# Patient Record
Sex: Male | Born: 1985 | Race: White | Hispanic: No | Marital: Married | Smoking: Never smoker
Health system: Southern US, Community
[De-identification: ages and names within clinical notes are randomized; demographics above are authoritative.]

## PROBLEM LIST (undated history)

## (undated) DIAGNOSIS — Z91018 Allergy to other foods: Secondary | ICD-10-CM

## (undated) HISTORY — DX: Allergy to other foods: Z91.018

## (undated) HISTORY — PX: WISDOM TOOTH EXTRACTION: SHX21

---

## 2015-01-29 ENCOUNTER — Emergency Department (HOSPITAL_COMMUNITY): Admission: EM | Admit: 2015-01-29 | Discharge: 2015-01-29 | Discharge status: Absent without leave | Payer: Self-pay

## 2015-01-29 ENCOUNTER — Emergency Department (HOSPITAL_COMMUNITY)
Admission: EM | Admit: 2015-01-29 | Discharge: 2015-01-29 | Discharge status: Absent without leave | Payer: Self-pay | Source: Home / Self Care

## 2015-01-29 ENCOUNTER — Encounter (HOSPITAL_COMMUNITY): Payer: Self-pay | Admitting: Emergency Medicine

## 2015-01-29 ENCOUNTER — Emergency Department (HOSPITAL_COMMUNITY)
Admission: EM | Admit: 2015-01-29 | Discharge: 2015-01-29 | Disposition: A | Discharge status: Home / Self Care | Payer: 59 | Source: Home / Self Care | Attending: Family Medicine | Admitting: Family Medicine

## 2015-01-29 DIAGNOSIS — A084 Viral intestinal infection, unspecified: Secondary | ICD-10-CM

## 2015-01-29 MED ORDER — ONDANSETRON 8 MG PO TBDP: 8.0000 mg | ORAL_TABLET | Freq: Three times a day (TID) | ORAL | Status: DC | PRN

## 2015-01-29 MED ORDER — SODIUM CHLORIDE 0.9 % IV BOLUS (SEPSIS)
1000.0000 mL | Freq: Once | INTRAVENOUS | Status: AC
  Administered 2015-01-29: 1000 mL via INTRAVENOUS

## 2015-01-29 MED ORDER — ONDANSETRON HCL 4 MG/2ML IJ SOLN
4.0000 mg | Freq: Once | INTRAMUSCULAR | Status: AC
  Administered 2015-01-29: 4 mg via INTRAVENOUS

## 2015-01-29 MED ORDER — ONDANSETRON HCL 4 MG/2ML IJ SOLN
INTRAMUSCULAR | Status: AC
Start: 2015-01-29 — End: 2015-01-29
  Filled 2015-01-29: qty 2

## 2015-01-29 MED ORDER — ONDANSETRON HCL 4 MG/2ML IJ SOLN: 4.0000 mg | Freq: Once | INTRAMUSCULAR | Status: DC

## 2015-01-29 NOTE — ED Provider Notes (Signed)
James Williams is a 29 y.o. male who presents to Urgent Care today for nausea vomiting diarrhea chills and lightheadedness. Symptoms present for 2 days. Patient has not tried any medications. His wife has similar symptoms. No chest pains palpitations or abdominal pain. No blood in the stool or vomit.   History reviewed. No pertinent past medical history. History reviewed. No pertinent past surgical history. History  Substance Use Topics  . Smoking status: Never Smoker   . Smokeless tobacco: Not on file  . Alcohol Use: No   ROS as above Medications: No current facility-administered medications for this encounter.   Current Outpatient Prescriptions  Medication Sig Dispense Refill  . ondansetron (ZOFRAN ODT) 8 MG disintegrating tablet Take 1 tablet (8 mg total) by mouth every 8 (eight) hours as needed for nausea or vomiting. 20 tablet 0   No Known Allergies   Exam:  BP 134/90 mmHg  Pulse 107  Temp(Src) 98.8 F (37.1 C) (Oral)  Resp 18  SpO2 100% Gen: Well NAD HEENT: EOMI,  MMM Lungs: Normal work of breathing. CTABL Heart: Tachycardia but regular rhythm no MRG Abd: NABS, Soft. Nondistended, Nontender Exts: Brisk capillary refill, warm and well perfused.   Patient was given a 1 L normal saline bolus, and 4 mg IV Zofran, and felt better  No results found for this or any previous visit (from the past 24 hour(s)). No results found.  Assessment and Plan: 29 y.o. male with viral gastroenteritis. Treat with oral Zofran. Return as needed.  Discussed warning signs or symptoms. Please see discharge instructions. Patient expresses understanding.     Rodolph BongEvan S Addison Freimuth, MD 01/29/15 [phone]

## 2015-01-29 NOTE — ED Notes (Signed)
Provided pillows/blankets

## 2015-01-29 NOTE — ED Notes (Signed)
C/o  Nausea, vomiting, and diarrhea.  Chills.   No fever.  Symptoms present since Tuesday.  No otc meds tried.

## 2015-01-29 NOTE — Discharge Instructions (Signed)
Thank you for coming in today. Call or go to the emergency room if you get worse, have trouble breathing, have chest pains, or palpitations.  If your belly pain worsens, or you have high fever, bad vomiting, blood in your stool or black tarry stool go to the Emergency Room.   Viral Gastroenteritis Viral gastroenteritis is also known as stomach flu. This condition affects the stomach and intestinal tract. It can cause sudden diarrhea and vomiting. The illness typically lasts 3 to 8 days. Most people develop an immune response that eventually gets rid of the virus. While this natural response develops, the virus can make you quite ill. CAUSES  Many different viruses can cause gastroenteritis, such as rotavirus or noroviruses. You can catch one of these viruses by consuming contaminated food or water. You may also catch a virus by sharing utensils or other personal items with an infected person or by touching a contaminated surface. SYMPTOMS  The most common symptoms are diarrhea and vomiting. These problems can cause a severe loss of body fluids (dehydration) and a body salt (electrolyte) imbalance. Other symptoms may include:  Fever.  Headache.  Fatigue.  Abdominal pain. DIAGNOSIS  Your caregiver can usually diagnose viral gastroenteritis based on your symptoms and a physical exam. A stool sample may also be taken to test for the presence of viruses or other infections. TREATMENT  This illness typically goes away on its own. Treatments are aimed at rehydration. The most serious cases of viral gastroenteritis involve vomiting so severely that you are not able to keep fluids down. In these cases, fluids must be given through an intravenous line (IV). HOME CARE INSTRUCTIONS   Drink enough fluids to keep your urine clear or pale yellow. Drink small amounts of fluids frequently and increase the amounts as tolerated.  Ask your caregiver for specific rehydration instructions.  Avoid:  Foods  high in sugar.  Alcohol.  Carbonated drinks.  Tobacco.  Juice.  Caffeine drinks.  Extremely hot or cold fluids.  Fatty, greasy foods.  Too much intake of anything at one time.  Dairy products until 24 to 48 hours after diarrhea stops.  You may consume probiotics. Probiotics are active cultures of beneficial bacteria. They may lessen the amount and number of diarrheal stools in adults. Probiotics can be found in yogurt with active cultures and in supplements.  Wash your hands well to avoid spreading the virus.  Only take over-the-counter or prescription medicines for pain, discomfort, or fever as directed by your caregiver. Do not give aspirin to children. Antidiarrheal medicines are not recommended.  Ask your caregiver if you should continue to take your regular prescribed and over-the-counter medicines.  Keep all follow-up appointments as directed by your caregiver. SEEK IMMEDIATE MEDICAL CARE IF:   You are unable to keep fluids down.  You do not urinate at least once every 6 to 8 hours.  You develop shortness of breath.  You notice blood in your stool or vomit. This may look like coffee grounds.  You have abdominal pain that increases or is concentrated in one small area (localized).  You have persistent vomiting or diarrhea.  You have a fever.  The patient is a child younger than 3 months, and he or she has a fever.  The patient is a child older than 3 months, and he or she has a fever and persistent symptoms.  The patient is a child older than 3 months, and he or she has a fever and symptoms suddenly get  worse.  The patient is a baby, and he or she has no tears when crying. MAKE SURE YOU:   Understand these instructions.  Will watch your condition.  Will get help right away if you are not doing well or get worse. Document Released: 10/10/2005 Document Revised: 01/02/2012 Document Reviewed: 07/27/2011 [facility] Patient Information 2015 [place].  This information is not intended to replace advice given to you by your health care provider. Make sure you discuss any questions you have with your health care provider.

## 2016-05-10 DIAGNOSIS — L562 Photocontact dermatitis [berloque dermatitis]: Secondary | ICD-10-CM | POA: Diagnosis not present

## 2016-05-13 MED FILL — predniSONE 10 MG TABS: 10 | 15 days supply | Qty: 46 | Fill #0

## 2016-06-01 DIAGNOSIS — E0590 Thyrotoxicosis, unspecified without thyrotoxic crisis or storm: Secondary | ICD-10-CM | POA: Diagnosis not present

## 2016-06-01 DIAGNOSIS — M25569 Pain in unspecified knee: Secondary | ICD-10-CM | POA: Diagnosis not present

## 2016-06-06 DIAGNOSIS — S4992XA Unspecified injury of left shoulder and upper arm, initial encounter: Secondary | ICD-10-CM | POA: Diagnosis not present

## 2016-06-06 DIAGNOSIS — M546 Pain in thoracic spine: Secondary | ICD-10-CM | POA: Diagnosis not present

## 2016-06-06 DIAGNOSIS — M25512 Pain in left shoulder: Secondary | ICD-10-CM | POA: Diagnosis not present

## 2016-06-06 DIAGNOSIS — M542 Cervicalgia: Secondary | ICD-10-CM | POA: Diagnosis not present

## 2016-06-06 DIAGNOSIS — S199XXA Unspecified injury of neck, initial encounter: Secondary | ICD-10-CM | POA: Diagnosis not present

## 2016-06-06 DIAGNOSIS — S299XXA Unspecified injury of thorax, initial encounter: Secondary | ICD-10-CM | POA: Diagnosis not present

## 2016-06-20 DIAGNOSIS — H5213 Myopia, bilateral: Secondary | ICD-10-CM | POA: Diagnosis not present

## 2016-08-25 DIAGNOSIS — Z299 Encounter for prophylactic measures, unspecified: Secondary | ICD-10-CM | POA: Diagnosis not present

## 2016-08-25 DIAGNOSIS — Z79899 Other long term (current) drug therapy: Secondary | ICD-10-CM | POA: Diagnosis not present

## 2016-08-25 DIAGNOSIS — R5383 Other fatigue: Secondary | ICD-10-CM | POA: Diagnosis not present

## 2016-08-25 DIAGNOSIS — Z0000 Encounter for general adult medical examination without abnormal findings: Secondary | ICD-10-CM | POA: Diagnosis not present

## 2016-08-25 DIAGNOSIS — Z87891 Personal history of nicotine dependence: Secondary | ICD-10-CM | POA: Diagnosis not present

## 2016-08-25 DIAGNOSIS — Z1389 Encounter for screening for other disorder: Secondary | ICD-10-CM | POA: Diagnosis not present

## 2016-10-29 ENCOUNTER — Encounter (HOSPITAL_COMMUNITY): Payer: Self-pay | Admitting: *Deleted

## 2016-10-29 ENCOUNTER — Ambulatory Visit (HOSPITAL_COMMUNITY)
Admission: EM | Admit: 2016-10-29 | Discharge: 2016-10-29 | Disposition: A | Discharge status: Home / Self Care | Payer: 59 | Attending: Family Medicine | Admitting: Family Medicine

## 2016-10-29 DIAGNOSIS — R05 Cough: Secondary | ICD-10-CM | POA: Diagnosis not present

## 2016-10-29 DIAGNOSIS — M791 Myalgia: Secondary | ICD-10-CM

## 2016-10-29 DIAGNOSIS — R509 Fever, unspecified: Secondary | ICD-10-CM | POA: Diagnosis not present

## 2016-10-29 DIAGNOSIS — J111 Influenza due to unidentified influenza virus with other respiratory manifestations: Secondary | ICD-10-CM

## 2016-10-29 DIAGNOSIS — R69 Illness, unspecified: Secondary | ICD-10-CM | POA: Diagnosis not present

## 2016-10-29 MED ORDER — IPRATROPIUM BROMIDE 0.06 % NA SOLN: 2.0000 | Freq: Four times a day (QID) | NASAL | 1 refills | Status: DC

## 2016-10-29 MED ORDER — GUAIFENESIN-CODEINE 100-10 MG/5ML PO SYRP: 10.0000 mL | ORAL_SOLUTION | Freq: Four times a day (QID) | ORAL | 0 refills | Status: DC | PRN

## 2016-10-29 NOTE — ED Triage Notes (Signed)
Coughing   Yesterday   Body  Aches          And fever     Dry   Cough           Symptoms   Worse  Today

## 2016-10-29 NOTE — ED Provider Notes (Signed)
[facility]    CSN: [id number] Arrival date & time: 10/29/16  1745     History   Chief Complaint Chief Complaint  Patient presents with  . Fever    HPI James Williams is a 31 y.o. male.   The history is provided by the patient and the spouse.  Influenza  Presenting symptoms: cough, fever, myalgias and rhinorrhea   Severity:  Mild Onset quality:  Sudden Duration:  1 day Progression:  Unchanged Chronicity:  New Relieved by:  None tried Ineffective treatments:  None tried Associated symptoms: chills, decreased appetite, decreased physical activity and nasal congestion   Risk factors: sick contacts     History reviewed. No pertinent past medical history.  There are no active problems to display for this patient.   No past surgical history on file.     Home Medications    Prior to Admission medications   Medication Sig Start Date End Date Taking? Authorizing Provider  guaiFENesin-codeine (ROBITUSSIN AC) 100-10 MG/5ML syrup Take 10 mLs by mouth 4 (four) times daily as needed for cough. 10/29/16   Linna HoffJames D Child Campoy, MD  ipratropium (ATROVENT) 0.06 % nasal spray Place 2 sprays into both nostrils 4 (four) times daily. 10/29/16   Linna HoffJames D Willye Javier, MD  ondansetron (ZOFRAN ODT) 8 MG disintegrating tablet Take 1 tablet (8 mg total) by mouth every 8 (eight) hours as needed for nausea or vomiting. 01/29/15   Rodolph BongEvan S Corey, MD    Family History History reviewed. No pertinent family history.  Social History Social History  Substance Use Topics  . Smoking status: Never Smoker  . Smokeless tobacco: Never Used  . Alcohol use No     Allergies   Patient has no known allergies.   Review of Systems Review of Systems  Constitutional: Positive for appetite change, chills, decreased appetite and fever.  HENT: Positive for congestion, postnasal drip and rhinorrhea.   Respiratory: Positive for cough.   Cardiovascular: Negative.   Gastrointestinal: Negative.   Musculoskeletal:  Positive for myalgias.  All other systems reviewed and are negative.    Physical Exam Triage Vital Signs ED Triage Vitals  Enc Vitals Group     BP 10/29/16 1823 116/70     Pulse Rate 10/29/16 1823 101     Resp 10/29/16 1823 20     Temp 10/29/16 1823 102.3 F (39.1 C)     Temp Source 10/29/16 1823 Oral     SpO2 10/29/16 1823 100 %     Weight --      Height --      Head Circumference --      Peak Flow --      Pain Score 10/29/16 1825 7     Pain Loc --      Pain Edu? --      Excl. in GC? --    No data found.   Updated Vital Signs BP 116/70 (BP Location: Left Arm)   Pulse 101   Temp 102.3 F (39.1 C) (Oral)   Resp 20   SpO2 100%   Visual Acuity Right Eye Distance:   Left Eye Distance:   Bilateral Distance:    Right Eye Near:   Left Eye Near:    Bilateral Near:     Physical Exam  Constitutional: He is oriented to person, place, and time. He appears well-developed and well-nourished. He appears distressed.  HENT:  Right Ear: External ear normal.  Left Ear: External ear normal.  Nose: Nose normal.  Mouth/Throat: Oropharynx is clear and moist.  Eyes: Pupils are equal, round, and reactive to light.  Neck: Normal range of motion.  Cardiovascular: Normal rate and regular rhythm.   Pulmonary/Chest: Effort normal and breath sounds normal.  Abdominal: Soft. Bowel sounds are normal.  Lymphadenopathy:    He has no cervical adenopathy.  Neurological: He is alert and oriented to person, place, and time.  Skin: Skin is warm and dry.  Nursing note and vitals reviewed.    UC Treatments / Results  Labs (all labs ordered are listed, but only abnormal results are displayed) Labs Reviewed - No data to display  EKG  EKG Interpretation None       Radiology No results found.  Procedures Procedures (including critical care time)  Medications Ordered in UC Medications - No data to display   Initial Impression / Assessment and Plan / UC Course  I have reviewed  the triage vital signs and the nursing notes.  Pertinent labs & imaging results that were available during my care of the patient were reviewed by me and considered in my medical decision making (see chart for details).       Final Clinical Impressions(s) / UC Diagnoses   Final diagnoses:  Influenza-like illness    New Prescriptions Discharge Medication List as of 10/29/2016  6:50 PM    START taking these medications   Details  guaiFENesin-codeine (ROBITUSSIN AC) 100-10 MG/5ML syrup Take 10 mLs by mouth 4 (four) times daily as needed for cough., Starting Sat 10/29/2016, Print    ipratropium (ATROVENT) 0.06 % nasal spray Place 2 sprays into both nostrils 4 (four) times daily., Starting Sat 10/29/2016, Print         Linna Hoff, MD 11/20/16 [phone]

## 2016-11-30 DIAGNOSIS — Z6834 Body mass index (BMI) 34.0-34.9, adult: Secondary | ICD-10-CM | POA: Diagnosis not present

## 2016-11-30 DIAGNOSIS — M546 Pain in thoracic spine: Secondary | ICD-10-CM | POA: Diagnosis not present

## 2016-11-30 DIAGNOSIS — Z713 Dietary counseling and surveillance: Secondary | ICD-10-CM | POA: Diagnosis not present

## 2016-11-30 DIAGNOSIS — Z299 Encounter for prophylactic measures, unspecified: Secondary | ICD-10-CM | POA: Diagnosis not present

## 2016-12-08 MED FILL — BACLOFEN 10 MG TABLET: 10 | 10 days supply | Qty: 20 | Fill #0

## 2017-01-09 DIAGNOSIS — Z789 Other specified health status: Secondary | ICD-10-CM | POA: Diagnosis not present

## 2017-01-09 DIAGNOSIS — Z299 Encounter for prophylactic measures, unspecified: Secondary | ICD-10-CM | POA: Diagnosis not present

## 2017-01-09 DIAGNOSIS — J069 Acute upper respiratory infection, unspecified: Secondary | ICD-10-CM | POA: Diagnosis not present

## 2017-01-09 DIAGNOSIS — Z713 Dietary counseling and surveillance: Secondary | ICD-10-CM | POA: Diagnosis not present

## 2017-01-09 DIAGNOSIS — Z6832 Body mass index (BMI) 32.0-32.9, adult: Secondary | ICD-10-CM | POA: Diagnosis not present

## 2017-03-03 MED FILL — DICLOFENAC SOD 75 MG TAB EC: 75 | 30 days supply | Qty: 60 | Fill #0

## 2017-04-21 DIAGNOSIS — E0590 Thyrotoxicosis, unspecified without thyrotoxic crisis or storm: Secondary | ICD-10-CM | POA: Diagnosis not present

## 2017-04-21 DIAGNOSIS — W57XXXA Bitten or stung by nonvenomous insect and other nonvenomous arthropods, initial encounter: Secondary | ICD-10-CM | POA: Diagnosis not present

## 2017-04-21 DIAGNOSIS — Z299 Encounter for prophylactic measures, unspecified: Secondary | ICD-10-CM | POA: Diagnosis not present

## 2017-04-21 DIAGNOSIS — Z6834 Body mass index (BMI) 34.0-34.9, adult: Secondary | ICD-10-CM | POA: Diagnosis not present

## 2017-04-21 DIAGNOSIS — Z713 Dietary counseling and surveillance: Secondary | ICD-10-CM | POA: Diagnosis not present

## 2017-04-28 MED FILL — DOXYCYCLINE HYCLATE 100 MG: 100 | 14 days supply | Qty: 28 | Fill #0

## 2017-05-19 ENCOUNTER — Ambulatory Visit: Payer: Self-pay | Admitting: Allergy and Immunology

## 2017-06-06 ENCOUNTER — Encounter: Payer: Self-pay | Admitting: Allergy and Immunology

## 2017-06-06 ENCOUNTER — Ambulatory Visit (INDEPENDENT_AMBULATORY_CARE_PROVIDER_SITE_OTHER): Payer: Self-pay | Admitting: Allergy and Immunology

## 2017-06-06 VITALS — BP 140/80 | HR 84 | Resp 16 | Ht 72.0 in | Wt 254.0 lb

## 2017-06-06 DIAGNOSIS — T7800XA Anaphylactic reaction due to unspecified food, initial encounter: Secondary | ICD-10-CM | POA: Diagnosis not present

## 2017-06-06 MED ORDER — EPINEPHRINE 0.3 MG/0.3ML IJ SOAJ: 0.3000 mg | Freq: Once | INTRAMUSCULAR | 1 refills | Status: AC

## 2017-06-06 NOTE — Progress Notes (Signed)
Dear [facility],  Thank you for referring [facility].   Below is a summation of this patient's evaluation and recommendations.  Thank you for your referral. I will keep you informed about this patient's response to treatment.   If you have any questions please do not hesitate to contact me.   Sincerely,  Jessica PriestEric J. Amelya Mabry, MD Allergy / Immunology [facility] [place]   ______________________________________________________________________    NEW PATIENT NOTE  [facility] Date of office visit: 06/06/2017    Subjective:   Chief Complaint:  James GlassmanJoshua R Williams (DOB: [birth year]) is a 31 y.o. male who presents to the clinic on 06/06/2017 with a chief complaint of New Patient (Initial Visit) .     HPI: [name] presents to this clinic in evaluation of possible alpha gal syndrome. Apparently in June 2018 he started to develop problems with abdominal discomfort and nausea after eating mammal meat. As well, he had these issues of lightheadedness and he had the development of some issues with not being able to focus mentally after eating meats. [place] the [place] episode where he ate a steak and about 2 hours later he developed a sensation of significant throat tightness and once again abdominal discomfort and nausea. His primary care doctor checked an alpha gal titer which was elevated and he has been mammal meat free since the end of June and has resolved all the issues with his abdominal discomfort and lightheadedness and feels great.  History reviewed. No pertinent past medical history.  History reviewed. No pertinent surgical history.  Allergies as of 06/06/2017      Reactions   Beef-derived Products    Alpha Gal   Lambs Quarters    Alpha Gal   Pork-derived Products    Alpha  Gal      Medication List      ipratropium 0.06 % nasal spray Commonly known as:  ATROVENT Place 2 sprays into both nostrils 4 (four) times daily.       Review of systems negative except as noted in HPI / PMHx or noted below:  Review of Systems  Constitutional: Negative.   HENT: Negative.   Eyes: Negative.   Respiratory: Negative.   Cardiovascular: Negative.   Gastrointestinal: Negative.   Genitourinary: Negative.   Musculoskeletal: Negative.   Skin: Negative.   Neurological: Negative.   Endo/Heme/Allergies: Negative.   Psychiatric/Behavioral: Negative.     History reviewed. No pertinent family history.  Social History   Social History  . Marital status: Married    Spouse name: N/A  . Number of children: N/A  . Years of education: N/A   Occupational History  . Not on file.   Social History Main Topics  . Smoking status: Never Smoker  . Smokeless tobacco: Never Used  . Alcohol use No  . Drug use: Unknown  . Sexual activity: Not on file   Other Topics Concern  . Not on file   Social History Narrative  . No narrative on file    Environmental and Social history  Lives in a house with a dry environment, no animals located inside the household, carpeting in the bedroom, plastic on the bed, no plastic on the pillow, no smokers located inside the household. He works in an office setting.  Objective:   Vitals:  06/06/17 1453  BP: 140/80  Pulse: 84  Resp: 16   Height: 6' (182.9 cm) Weight: 254 lb (115.2 kg)  Physical Exam  Constitutional: He is well-developed, well-nourished, and in no distress.  HENT:  Head: Normocephalic. Head is without right periorbital erythema and without left periorbital erythema.  Right Ear: Tympanic membrane, external ear and ear canal normal.  Left Ear: Tympanic membrane, external ear and ear canal normal.  Nose: Nose normal. No mucosal edema or rhinorrhea.  Mouth/Throat: Uvula is midline, oropharynx is clear and moist  and mucous membranes are normal. No oropharyngeal exudate.  Eyes: Pupils are equal, round, and reactive to light. Conjunctivae and lids are normal.  Neck: Trachea normal. No tracheal tenderness present. No tracheal deviation present. No thyromegaly present.  Cardiovascular: Normal rate, regular rhythm, S1 normal, S2 normal and normal heart sounds.   No murmur heard. Pulmonary/Chest: Effort normal and breath sounds normal. No stridor. No tachypnea. No respiratory distress. He has no wheezes. He has no rales. He exhibits no tenderness.  Abdominal: Soft. He exhibits no distension and no mass. There is no hepatosplenomegaly. There is no tenderness. There is no rebound and no guarding.  Musculoskeletal: He exhibits no edema or tenderness.  Lymphadenopathy:       Head (right side): No tonsillar adenopathy present.       Head (left side): No tonsillar adenopathy present.    He has no cervical adenopathy.    He has no axillary adenopathy.  Neurological: He is alert. Gait normal.  Skin: No rash noted. He is not diaphoretic. No erythema. No pallor. Nails show no clubbing.  Psychiatric: Mood and affect normal.    Diagnostics: Review of blood tests forwarded by [facility] medicine identifies a alpha gal IgE titer of 1.72 KU/L with beef at 0.63 KU/L and negative Lamb and pork IgE.  Assessment and Plan:    1. Anaphylactic shock due to food, initial encounter     1. Allergen avoidance measures directed against mammal consumption  2. Auvi-Q 0.3, Benadryl, M.D./ER evaluation for allergic reaction  3. Return to clinic in 1 year or earlier if problem  4. Repeat blood tests in one year  5. 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 He will need to remain away from mammal meat consumption for the next year and he needs to be very careful when he goes out to eat at restaurants and loses control of preparation of his  foods. I will see him back in this clinic in 1 year and we will recheck his titers at that point in time and see if he is a candidate for possible in clinic food challenge.  Jessica Priest, MD Allergy / Immunology  [facility]

## 2017-06-06 NOTE — Patient Instructions (Addendum)
  1. Allergen avoidance measures directed against mammal consumption  2. Auvi-Q 0.3, Benadryl, M.D./ER evaluation for allergic reaction  3. Return to clinic in 1 year or earlier if problem  4. Repeat blood tests in one year  5. Obtain a flu vaccine this fall

## 2017-06-07 ENCOUNTER — Encounter: Payer: Self-pay | Admitting: Allergy and Immunology

## 2017-06-07 ENCOUNTER — Encounter: Payer: Self-pay | Admitting: *Deleted

## 2017-08-30 DIAGNOSIS — Z1331 Encounter for screening for depression: Secondary | ICD-10-CM | POA: Diagnosis not present

## 2017-08-30 DIAGNOSIS — Z0000 Encounter for general adult medical examination without abnormal findings: Secondary | ICD-10-CM | POA: Diagnosis not present

## 2017-08-30 DIAGNOSIS — R5383 Other fatigue: Secondary | ICD-10-CM | POA: Diagnosis not present

## 2017-08-30 DIAGNOSIS — Z299 Encounter for prophylactic measures, unspecified: Secondary | ICD-10-CM | POA: Diagnosis not present

## 2017-08-30 DIAGNOSIS — E0590 Thyrotoxicosis, unspecified without thyrotoxic crisis or storm: Secondary | ICD-10-CM | POA: Diagnosis not present

## 2017-08-30 DIAGNOSIS — Z79899 Other long term (current) drug therapy: Secondary | ICD-10-CM | POA: Diagnosis not present

## 2017-08-30 DIAGNOSIS — Z6834 Body mass index (BMI) 34.0-34.9, adult: Secondary | ICD-10-CM | POA: Diagnosis not present

## 2017-12-05 MED FILL — CHERATUSSIN AC SYRUP: 100-10 | 12 days supply | Qty: 120 | Fill #0

## 2017-12-05 MED FILL — BENZONATATE 100 MG CAPS: 100 | 10 days supply | Qty: 30 | Fill #0

## 2018-03-29 MED FILL — AMOX-CLAV 875-125 MG TABLET: 875-125 | 10 days supply | Qty: 20 | Fill #0

## 2018-04-05 ENCOUNTER — Encounter: Payer: Self-pay | Admitting: Allergy and Immunology

## 2018-04-05 ENCOUNTER — Ambulatory Visit (INDEPENDENT_AMBULATORY_CARE_PROVIDER_SITE_OTHER): Payer: No Typology Code available for payment source | Admitting: Allergy and Immunology

## 2018-04-05 VITALS — BP 122/84 | HR 72 | Resp 20

## 2018-04-05 DIAGNOSIS — T7800XD Anaphylactic reaction due to unspecified food, subsequent encounter: Secondary | ICD-10-CM

## 2018-04-05 MED ORDER — AUVI-Q 0.3 MG/0.3ML IJ SOAJ: INTRAMUSCULAR | 1 refills | Status: DC

## 2018-04-05 NOTE — Patient Instructions (Addendum)
  1. Allergen avoidance measures directed against mammal consumption  2. Auvi-Q 0.3, Benadryl, M.D./ER evaluation for allergic reaction  3. Blood -alpha gal panel  4.  Mammal challenge?

## 2018-04-05 NOTE — Progress Notes (Signed)
     Follow-up Note  [facility] Primary Provider: Selinda FlavinHoward, Kevin, MD Date of Office Visit: 04/05/2018  Subjective:   James GlassmanJoshua R Williams (DOB: [birth year]) is a 32 y.o. male who [facility] on 04/05/2018 in re-evaluation of the following:  HPI: James BootyJoshua presents to this clinic in evaluation of alpha gal syndrome.  I have not seen him in his clinic since 06 June 2017.  He has not been consuming any mammal and he has not been having any abdominal discomfort or nausea or other signs of allergic reaction.  He does have an injectable epinephrine device.  Allergies as of 04/05/2018      Reactions   Beef-derived Products    Alpha Gal   Lambs Quarters    Alpha Gal   Pork-derived Products    Alpha Gal      Medication List      amoxicillin-clavulanate 875-125 MG tablet Commonly known as:  AUGMENTIN TAKE 1 TABLET BY MOUTH TWO TIMES DAILY WITH FOOD       Past Medical History:  Diagnosis Date  . Allergy to alpha-gal    Mammal meat allergy    History reviewed. No pertinent surgical history.  Review of systems negative except as noted in HPI / PMHx or noted below:  Review of Systems  Constitutional: Negative.   HENT: Negative.   Eyes: Negative.   Respiratory: Negative.   Cardiovascular: Negative.   Gastrointestinal: Negative.   Genitourinary: Negative.   Musculoskeletal: Negative.   Skin: Negative.   Neurological: Negative.   Endo/Heme/Allergies: Negative.   Psychiatric/Behavioral: Negative.      Objective:   Vitals:   04/05/18 1604  BP: 122/84  Pulse: 72  Resp: 20          Physical Exam  HENT:  Head: Normocephalic.  Right Ear: Tympanic membrane, external ear and ear canal normal.  Left Ear: Tympanic membrane, external ear and ear canal normal.  Nose: Nose normal. No mucosal edema or rhinorrhea.  Mouth/Throat: Uvula is midline, oropharynx is clear and moist and mucous membranes are normal. No  oropharyngeal exudate.  Eyes: Conjunctivae are normal.  Neck: Trachea normal. No tracheal tenderness present. No tracheal deviation present. No thyromegaly present.  Cardiovascular: Normal rate, regular rhythm, S1 normal, S2 normal and normal heart sounds.  No murmur heard. Pulmonary/Chest: Breath sounds normal. No stridor. No respiratory distress. He has no wheezes. He has no rales.  Musculoskeletal: He exhibits no edema.  Lymphadenopathy:       Head (right side): No tonsillar adenopathy present.       Head (left side): No tonsillar adenopathy present.    He has no cervical adenopathy.  Neurological: He is alert.  Skin: No rash noted. He is not diaphoretic. No erythema. Nails show no clubbing.    Diagnostics: none  Assessment and Plan:   1. Anaphylactic shock due to food, subsequent encounter     1. Allergen avoidance measures directed against mammal consumption  2. Auvi-Q 0.3, Benadryl, M.D./ER evaluation for allergic reaction  3. Blood -alpha gal panel  4.  Mammal challenge?  James BootyJoshua will investigate his alpha gal syndrome with the blood tests noted above and will see if he is a candidate for mammal challenge.  I will contact him with the results of his blood test once it is available for review.  Laurette SchimkeEric Akim Watkinson, MD Allergy / Immunology [facility]

## 2018-04-09 ENCOUNTER — Encounter: Payer: Self-pay | Admitting: Allergy and Immunology

## 2018-04-24 LAB — ALPHA-GAL PANEL
Class Interpretation: 0
Class Interpretation: 0
Class Interpretation: 0
Lamb/Mutton (Ovis spp) IgE: <0.1 kU/L (ref <0.35)
Pork (Sus spp) IgE: <0.1 kU/L (ref <0.35)

## 2018-04-25 ENCOUNTER — Telehealth: Payer: Self-pay | Admitting: Allergy and Immunology

## 2018-04-25 MED FILL — EPINEPHRINE 0.3 MG AUTO-INJ: 0.3 | 30 days supply | Qty: 2 | Fill #0

## 2018-04-25 NOTE — Telephone Encounter (Signed)
James Williams and his wife have called several times.  They would like the results of Kashis's lab results please.

## 2019-07-02 MED FILL — METHYLPREDNISOLONE 4 MG TAB: 4 | 6 days supply | Qty: 21 | Fill #0

## 2019-09-24 MED FILL — predniSONE 10 MG TABS: 10 | 6 days supply | Qty: 21 | Fill #0

## 2019-10-07 MED FILL — CYCLOBENZAPRINE 10 MG TAB: 10 | 10 days supply | Qty: 30 | Fill #0

## 2020-04-13 MED FILL — MECLIZINE 25 MG TABLET: 25 | 14 days supply | Qty: 42 | Fill #0

## 2020-04-13 MED FILL — OMEPRAZOLE 40 MG CPDR: 40 | 30 days supply | Qty: 30 | Fill #0

## 2020-11-05 ENCOUNTER — Other Ambulatory Visit: Payer: Self-pay | Admitting: Surgery

## 2020-11-05 DIAGNOSIS — K8010 Calculus of gallbladder with chronic cholecystitis without obstruction: Secondary | ICD-10-CM

## 2020-11-13 ENCOUNTER — Ambulatory Visit
Admission: RE | Admit: 2020-11-13 | Discharge: 2020-11-13 | Disposition: A | Discharge status: Home / Self Care | Payer: No Typology Code available for payment source | Source: Ambulatory Visit | Attending: Surgery | Admitting: Surgery

## 2020-11-13 ENCOUNTER — Other Ambulatory Visit: Payer: Self-pay

## 2020-11-13 DIAGNOSIS — K8010 Calculus of gallbladder with chronic cholecystitis without obstruction: Secondary | ICD-10-CM

## 2020-12-28 ENCOUNTER — Other Ambulatory Visit (HOSPITAL_COMMUNITY)
Admission: RE | Admit: 2020-12-28 | Discharge: 2020-12-28 | Disposition: A | Discharge status: Home / Self Care | Payer: No Typology Code available for payment source | Source: Ambulatory Visit | Attending: Surgery | Admitting: Surgery

## 2020-12-28 DIAGNOSIS — Z01812 Encounter for preprocedural laboratory examination: Secondary | ICD-10-CM | POA: Insufficient documentation

## 2020-12-28 DIAGNOSIS — Z20822 Contact with and (suspected) exposure to covid-19: Secondary | ICD-10-CM | POA: Diagnosis not present

## 2020-12-28 LAB — SARS CORONAVIRUS 2 (TAT 6-24 HRS): SARS Coronavirus 2: NEGATIVE

## 2020-12-30 ENCOUNTER — Encounter (HOSPITAL_COMMUNITY): Payer: Self-pay

## 2020-12-30 ENCOUNTER — Encounter (HOSPITAL_COMMUNITY)
Admission: RE | Admit: 2020-12-30 | Discharge: 2020-12-30 | Disposition: A | Discharge status: Home / Self Care | Payer: No Typology Code available for payment source | Source: Ambulatory Visit | Attending: Surgery | Admitting: Surgery

## 2020-12-30 ENCOUNTER — Other Ambulatory Visit: Payer: Self-pay

## 2020-12-30 ENCOUNTER — Ambulatory Visit: Payer: Self-pay | Admitting: Surgery

## 2020-12-30 DIAGNOSIS — Z01812 Encounter for preprocedural laboratory examination: Secondary | ICD-10-CM | POA: Diagnosis present

## 2020-12-30 LAB — CBC
HCT: 50 % (ref 39.0–52.0)
Hemoglobin: 16.9 g/dL (ref 13.0–17.0)
MCH: 30.2 pg (ref 26.0–34.0)
MCHC: 33.8 g/dL (ref 30.0–36.0)
MCV: 89.4 fL (ref 80.0–100.0)
Platelets: 219 10*3/uL (ref 150–400)
RBC: 5.59 MIL/uL (ref 4.22–5.81)
RDW: 12.4 % (ref 11.5–15.5)
WBC: 7.7 10*3/uL (ref 4.0–10.5)
nRBC: 0 % (ref 0.0–0.2)

## 2020-12-30 LAB — BASIC METABOLIC PANEL
Anion gap: 8 (ref 5–15)
BUN: 14 mg/dL (ref 6–20)
CO2: 26 mmol/L (ref 22–32)
Calcium: 9.3 mg/dL (ref 8.9–10.3)
Chloride: 106 mmol/L (ref 98–111)
Creatinine, Ser: 0.83 mg/dL (ref 0.61–1.24)
GFR, Estimated: >60 mL/min (ref >60)
Glucose, Bld: 102 mg/dL — ABNORMAL HIGH (ref 70–99)
Potassium: 3.9 mmol/L (ref 3.5–5.1)
Sodium: 140 mmol/L (ref 135–145)

## 2020-12-30 NOTE — Progress Notes (Addendum)
PCP - Dr. Quintin [facility]  Cardiologist - no  PPM/ICD -  Device Orders -  Rep Notified -   Chest x-ray -  EKG -  Stress Test -  ECHO -  Cardiac Cath -   Sleep Study -  CPAP -   Fasting Blood Sugar -  Checks Blood Sugar _____ times a day  Blood Thinner Instructions: Aspirin Instructions:  ERAS Protcol - PRE-SURGERY Ensure or G2-   COVID TEST- 12-28-20  Activity - able to walk a flight of stairs without SOB Anesthesia review: Alpha GAl  Patient denies shortness of breath, fever, cough and chest pain at PAT appointment   All instructions explained to the patient, with a verbal understanding of the material. Patient agrees to go over the instructions while at home for a better understanding. Patient also instructed to self quarantine after being tested for COVID-19. The opportunity to ask questions was provided.

## 2020-12-30 NOTE — Patient Instructions (Signed)
DUE TO COVID-19 ONLY ONE VISITOR IS ALLOWED TO COME WITH YOU AND STAY IN THE WAITING ROOM ONLY DURING PRE OP AND PROCEDURE DAY OF SURGERY.   TWO VISITORS  MAY VISIT WITH YOU AFTER SURGERY IN YOUR PRIVATE ROOM DURING VISITING HOURS ONLY!  10a -8p  YOU NEED TO HAVE A COVID 19 TEST ON__completed_____ @_______ , THIS TEST MUST BE DONE BEFORE SURGERY,  COVID TESTING SITE [address] [place] , IT IS ON THE RIGHT GOING OUT [address] APPROXIMATELY  2 MINUTES PAST [employer] ON THE RIGHT. ONCE YOUR COVID TEST IS COMPLETED,  PLEASE BEGIN THE QUARANTINE INSTRUCTIONS AS OUTLINED IN YOUR HANDOUT.                James Williams  12/30/2020   Your procedure is scheduled on: 12-31-20   Report to [facility] Main  Entrance   Report to short stay   at       0530  AM     Call this number if you have problems the morning of surgery [phone]    Remember: Do not eat food  :After Midnight.  You may have clear liquids until 0430 am then nothing by mouth     CLEAR LIQUID DIET                                                               Water Black Coffee and tea, regular and decaf                             Plain Jell-O any favor except red or purple                                            Fruit ices (not with fruit pulp)                                      Iced Popsicles                                                                  Cranberry, grape and apple juices Sports drinks like Gatorade Sugar, honey syrup      BRUSH YOUR TEETH MORNING OF SURGERY AND RINSE YOUR MOUTH OUT, NO CHEWING GUM CANDY OR MINTS.     Take these medicines the morning of surgery with A SIP OF WATER: NONE                                 You may not have any metal on your body including hair pins and              piercings  Do not wear jewelry,lotions, powders or perfumes, deodorant  Men may shave face and neck.   Do not bring valuables to the  hospital. [facility] IS NOT             RESPONSIBLE   FOR VALUABLES.  Contacts, dentures or bridgework may not be worn into surgery.      Patients discharged the day of surgery will not be allowed to drive home. IF YOU ARE HAVING SURGERY AND GOING HOME THE SAME DAY, YOU MUST HAVE AN ADULT TO DRIVE YOU HOME AND BE WITH YOU FOR 24 HOURS. YOU MAY GO HOME BY TAXI OR UBER OR ORTHERWISE, BUT AN ADULT MUST ACCOMPANY YOU HOME AND STAY WITH YOU FOR 24 HOURS.  Name and phone number of your driver:  Special Instructions: N/A              Please read over the following fact sheets you were given: _____________________________________________________________________             [facility] - Preparing for Surgery Before surgery, you can play an important role.  Because skin is not sterile, your skin needs to be as free of germs as possible.  You can reduce the number of germs on your skin by washing with CHG (chlorahexidine gluconate) soap before surgery.  CHG is an antiseptic cleaner which kills germs and bonds with the skin to continue killing germs even after washing. Please DO NOT use if you have an allergy to CHG or antibacterial soaps.  If your skin becomes reddened/irritated stop using the CHG and inform your nurse when you arrive at Short Stay. Do not shave (including legs and underarms) for at least 48 hours prior to the first CHG shower.  You may shave your face/neck. Please follow these instructions carefully:  1.  Shower with CHG Soap the night before surgery and the  morning of Surgery.  2.  If you choose to wash your hair, wash your hair first as usual with your  normal  shampoo.  3.  After you shampoo, rinse your hair and body thoroughly to remove the  shampoo.                           4.  Use CHG as you would any other liquid soap.  You can apply chg directly  to the skin and wash                       Gently with a scrungie or clean washcloth.  5.  Apply the CHG Soap to your body ONLY  FROM THE NECK DOWN.   Do not use on face/ open                           Wound or open sores. Avoid contact with eyes, ears mouth and genitals (private parts).                       Wash face,  Genitals (private parts) with your normal soap.             6.  Wash thoroughly, paying special attention to the area where your surgery  will be performed.  7.  Thoroughly rinse your body with warm water from the neck down.  8.  DO NOT shower/wash with your normal soap after using and rinsing off  the CHG Soap.  9.  Pat yourself dry with a clean towel.            10.  Wear clean pajamas.            11.  Place clean sheets on your bed the night of your first shower and do not  sleep with pets. Day of Surgery : Do not apply any lotions/deodorants the morning of surgery.  Please wear clean clothes to the hospital/surgery center.  FAILURE TO FOLLOW THESE INSTRUCTIONS MAY RESULT IN THE CANCELLATION OF YOUR SURGERY PATIENT SIGNATURE_________________________________  NURSE SIGNATURE__________________________________  ________________________________________________________________________

## 2020-12-30 NOTE — Anesthesia Preprocedure Evaluation (Addendum)
Anesthesia Evaluation  Patient identified by MRN, date of birth, ID band Patient awake    Reviewed: Allergy & Precautions, NPO status , Patient's Chart, lab work & pertinent test results  Airway Mallampati: II  TM Distance: >3 FB Neck ROM: Full    Dental  (+) Teeth Intact,    Pulmonary neg pulmonary ROS,    Pulmonary exam normal        Cardiovascular negative cardio ROS   Rhythm:Regular Rate:Normal     Neuro/Psych negative neurological ROS  negative psych ROS   GI/Hepatic Neg liver ROS, Gallstones with chronic cholecystitis    Endo/Other  negative endocrine ROS  Renal/GU negative Renal ROS  negative genitourinary   Musculoskeletal negative musculoskeletal ROS (+)   Abdominal (+)  Abdomen: soft. Bowel sounds: normal.  Peds  Hematology negative hematology ROS (+)   Anesthesia Other Findings   Reproductive/Obstetrics                            Anesthesia Physical Anesthesia Plan  ASA: II  Anesthesia Plan: General   Post-op Pain Management:    Induction: Intravenous  PONV Risk Score and Plan: 2 and Ondansetron, Dexamethasone, Midazolam and Treatment may vary due to age or medical condition  Airway Management Planned: Mask and Oral ETT  Additional Equipment: None  Intra-op Plan:   Post-operative Plan: Extubation in OR  Informed Consent: I have reviewed the patients History and Physical, chart, labs and discussed the procedure including the risks, benefits and alternatives for the proposed anesthesia with the patient or authorized representative who has indicated his/her understanding and acceptance.     Dental advisory given  Plan Discussed with: CRNA  Anesthesia Plan Comments: (Lab Results      Component                Value               Date                      WBC                      7.7                 12/30/2020                HGB                      16.9                 12/30/2020                HCT                      50.0                12/30/2020                MCV                      89.4                12/30/2020                PLT                      219  12/30/2020           Lab Results      Component                Value               Date                      NA                       140                 12/30/2020                K                        3.9                 12/30/2020                CO2                      26                  12/30/2020                GLUCOSE                  102 (H)             12/30/2020                BUN                      14                  12/30/2020                CREATININE               0.83                12/30/2020                CALCIUM                  9.3                 12/30/2020                GFRNONAA                 >60                 12/30/2020          )       Anesthesia Quick Evaluation

## 2020-12-31 ENCOUNTER — Other Ambulatory Visit (HOSPITAL_COMMUNITY): Payer: Self-pay | Admitting: Surgery

## 2020-12-31 ENCOUNTER — Ambulatory Visit (HOSPITAL_COMMUNITY): Payer: No Typology Code available for payment source

## 2020-12-31 ENCOUNTER — Ambulatory Visit (HOSPITAL_COMMUNITY): Payer: No Typology Code available for payment source | Admitting: Anesthesiology

## 2020-12-31 ENCOUNTER — Encounter (HOSPITAL_COMMUNITY)
Admission: RE | Disposition: A | Discharge status: Home / Self Care | Payer: Self-pay | Source: Home / Self Care | Attending: Surgery

## 2020-12-31 ENCOUNTER — Encounter (HOSPITAL_COMMUNITY): Payer: Self-pay | Admitting: Surgery

## 2020-12-31 ENCOUNTER — Ambulatory Visit (HOSPITAL_COMMUNITY)
Admission: RE | Admit: 2020-12-31 | Discharge: 2020-12-31 | Disposition: A | Discharge status: Home / Self Care | Payer: No Typology Code available for payment source | Attending: Surgery | Admitting: Surgery

## 2020-12-31 DIAGNOSIS — K8020 Calculus of gallbladder without cholecystitis without obstruction: Secondary | ICD-10-CM | POA: Diagnosis present

## 2020-12-31 DIAGNOSIS — K8010 Calculus of gallbladder with chronic cholecystitis without obstruction: Secondary | ICD-10-CM | POA: Insufficient documentation

## 2020-12-31 DIAGNOSIS — Z419 Encounter for procedure for purposes other than remedying health state, unspecified: Secondary | ICD-10-CM

## 2020-12-31 DIAGNOSIS — K819 Cholecystitis, unspecified: Secondary | ICD-10-CM | POA: Diagnosis present

## 2020-12-31 DIAGNOSIS — Z9049 Acquired absence of other specified parts of digestive tract: Secondary | ICD-10-CM

## 2020-12-31 HISTORY — PX: CHOLECYSTECTOMY: SHX55

## 2020-12-31 SURGERY — LAPAROSCOPIC CHOLECYSTECTOMY WITH INTRAOPERATIVE CHOLANGIOGRAM
Anesthesia: General | Site: Abdomen

## 2020-12-31 MED ORDER — ONDANSETRON HCL 4 MG/2ML IJ SOLN
INTRAMUSCULAR | Status: AC
  Filled 2020-12-31: qty 2

## 2020-12-31 MED ORDER — SODIUM CHLORIDE 0.9 % IV SOLN
INTRAVENOUS | Status: DC | PRN
  Administered 2020-12-31: 8 mL

## 2020-12-31 MED ORDER — PROPOFOL 10 MG/ML IV BOLUS
INTRAVENOUS | Status: AC
  Filled 2020-12-31: qty 20

## 2020-12-31 MED ORDER — LACTATED RINGERS IV SOLN: INTRAVENOUS | Status: DC

## 2020-12-31 MED ORDER — DEXMEDETOMIDINE (PRECEDEX) IN NS 20 MCG/5ML (4 MCG/ML) IV SYRINGE
PREFILLED_SYRINGE | INTRAVENOUS | Status: AC
  Filled 2020-12-31: qty 5

## 2020-12-31 MED ORDER — SODIUM CHLORIDE 0.9 % IR SOLN
Status: DC | PRN
  Administered 2020-12-31: 1000 mL

## 2020-12-31 MED ORDER — DEXAMETHASONE SODIUM PHOSPHATE 10 MG/ML IJ SOLN
INTRAMUSCULAR | Status: DC | PRN
  Administered 2020-12-31: 10 mg via INTRAVENOUS

## 2020-12-31 MED ORDER — MIDAZOLAM HCL 2 MG/2ML IJ SOLN
INTRAMUSCULAR | Status: AC
  Filled 2020-12-31: qty 2

## 2020-12-31 MED ORDER — PROPOFOL 10 MG/ML IV BOLUS
INTRAVENOUS | Status: DC | PRN
  Administered 2020-12-31: 300 mg via INTRAVENOUS

## 2020-12-31 MED ORDER — DEXMEDETOMIDINE (PRECEDEX) IN NS 20 MCG/5ML (4 MCG/ML) IV SYRINGE
PREFILLED_SYRINGE | INTRAVENOUS | Status: DC | PRN
  Administered 2020-12-31: 8 ug via INTRAVENOUS
  Administered 2020-12-31: 4 ug via INTRAVENOUS
  Administered 2020-12-31 (×2): 8 ug via INTRAVENOUS

## 2020-12-31 MED ORDER — MIDAZOLAM HCL 5 MG/5ML IJ SOLN
INTRAMUSCULAR | Status: DC | PRN
  Administered 2020-12-31: 2 mg via INTRAVENOUS

## 2020-12-31 MED ORDER — DEXAMETHASONE SODIUM PHOSPHATE 10 MG/ML IJ SOLN
INTRAMUSCULAR | Status: AC
  Filled 2020-12-31: qty 1

## 2020-12-31 MED ORDER — HYDROMORPHONE HCL 1 MG/ML IJ SOLN: 0.2500 mg | INTRAMUSCULAR | Status: DC | PRN

## 2020-12-31 MED ORDER — ROCURONIUM BROMIDE 10 MG/ML (PF) SYRINGE
PREFILLED_SYRINGE | INTRAVENOUS | Status: DC | PRN
  Administered 2020-12-31: 20 mg via INTRAVENOUS
  Administered 2020-12-31: 10 mg via INTRAVENOUS
  Administered 2020-12-31: 50 mg via INTRAVENOUS
  Administered 2020-12-31: 10 mg via INTRAVENOUS

## 2020-12-31 MED ORDER — LIDOCAINE 2% (20 MG/ML) 5 ML SYRINGE
INTRAMUSCULAR | Status: DC | PRN
  Administered 2020-12-31: 100 mg via INTRAVENOUS

## 2020-12-31 MED ORDER — SUCCINYLCHOLINE CHLORIDE 200 MG/10ML IV SOSY
PREFILLED_SYRINGE | INTRAVENOUS | Status: DC | PRN
  Administered 2020-12-31: 120 mg via INTRAVENOUS

## 2020-12-31 MED ORDER — FENTANYL CITRATE (PF) 100 MCG/2ML IJ SOLN
INTRAMUSCULAR | Status: DC | PRN
  Administered 2020-12-31: 25 ug via INTRAVENOUS
  Administered 2020-12-31: 150 ug via INTRAVENOUS
  Administered 2020-12-31 (×2): 50 ug via INTRAVENOUS
  Administered 2020-12-31: 25 ug via INTRAVENOUS
  Administered 2020-12-31: 50 ug via INTRAVENOUS

## 2020-12-31 MED ORDER — ONDANSETRON HCL 4 MG/2ML IJ SOLN
INTRAMUSCULAR | Status: DC | PRN
  Administered 2020-12-31: 4 mg via INTRAVENOUS

## 2020-12-31 MED ORDER — SCOPOLAMINE 1 MG/3DAYS TD PT72
1.0000 | MEDICATED_PATCH | TRANSDERMAL | Status: DC
  Administered 2020-12-31: 1.5 mg via TRANSDERMAL
  Filled 2020-12-31: qty 1

## 2020-12-31 MED ORDER — ROCURONIUM BROMIDE 10 MG/ML (PF) SYRINGE
PREFILLED_SYRINGE | INTRAVENOUS | Status: AC
  Filled 2020-12-31: qty 10

## 2020-12-31 MED ORDER — PROPOFOL 10 MG/ML IV BOLUS
INTRAVENOUS | Status: AC
  Filled 2020-12-31: qty 40

## 2020-12-31 MED ORDER — FENTANYL CITRATE (PF) 250 MCG/5ML IJ SOLN
INTRAMUSCULAR | Status: AC
  Filled 2020-12-31: qty 5

## 2020-12-31 MED ORDER — PROMETHAZINE HCL 25 MG/ML IJ SOLN: 6.2500 mg | INTRAMUSCULAR | Status: DC | PRN

## 2020-12-31 MED ORDER — HYDROCODONE-ACETAMINOPHEN 5-325 MG PO TABS: 1.0000 | ORAL_TABLET | Freq: Four times a day (QID) | ORAL | 0 refills | Status: DC | PRN

## 2020-12-31 MED ORDER — CHLORHEXIDINE GLUCONATE CLOTH 2 % EX PADS: 6.0000 | MEDICATED_PAD | Freq: Once | CUTANEOUS | Status: DC

## 2020-12-31 MED ORDER — OXYCODONE HCL 5 MG PO TABS
5.0000 mg | ORAL_TABLET | Freq: Once | ORAL | Status: AC | PRN
  Administered 2020-12-31: 5 mg via ORAL

## 2020-12-31 MED ORDER — OXYCODONE HCL 5 MG/5ML PO SOLN: 5.0000 mg | Freq: Once | ORAL | Status: AC | PRN

## 2020-12-31 MED ORDER — BUPIVACAINE LIPOSOME 1.3 % IJ SUSP
20.0000 mL | Freq: Once | INTRAMUSCULAR | Status: AC
  Administered 2020-12-31: 20 mL
  Filled 2020-12-31: qty 20

## 2020-12-31 MED ORDER — SUGAMMADEX SODIUM 200 MG/2ML IV SOLN
INTRAVENOUS | Status: DC | PRN
  Administered 2020-12-31: 300 mg via INTRAVENOUS

## 2020-12-31 MED ORDER — CHLORHEXIDINE GLUCONATE 0.12 % MT SOLN
15.0000 mL | Freq: Once | OROMUCOSAL | Status: AC
  Administered 2020-12-31: 15 mL via OROMUCOSAL

## 2020-12-31 MED ORDER — OXYCODONE HCL 5 MG PO TABS
ORAL_TABLET | ORAL | Status: AC
  Filled 2020-12-31: qty 1

## 2020-12-31 MED ORDER — ACETAMINOPHEN 500 MG PO TABS
1000.0000 mg | ORAL_TABLET | ORAL | Status: AC
  Administered 2020-12-31: 1000 mg via ORAL
  Filled 2020-12-31: qty 2

## 2020-12-31 MED ORDER — FENTANYL CITRATE (PF) 100 MCG/2ML IJ SOLN
INTRAMUSCULAR | Status: AC
  Filled 2020-12-31: qty 2

## 2020-12-31 MED ORDER — LIDOCAINE 2% (20 MG/ML) 5 ML SYRINGE
INTRAMUSCULAR | Status: AC
  Filled 2020-12-31: qty 10

## 2020-12-31 MED ORDER — LIDOCAINE 2% (20 MG/ML) 5 ML SYRINGE
INTRAMUSCULAR | Status: AC
  Filled 2020-12-31: qty 5

## 2020-12-31 MED ORDER — ORAL CARE MOUTH RINSE: 15.0000 mL | Freq: Once | OROMUCOSAL | Status: AC

## 2020-12-31 MED ORDER — CEFAZOLIN SODIUM-DEXTROSE 2-4 GM/100ML-% IV SOLN
2.0000 g | INTRAVENOUS | Status: AC
  Administered 2020-12-31: 2 g via INTRAVENOUS
  Filled 2020-12-31: qty 100

## 2020-12-31 MED FILL — HYDROCODON-APAP 5-325: 5-325 | 4 days supply | Qty: 15 | Fill #0

## 2020-12-31 SURGICAL SUPPLY — 47 items
ADH SKN CLS APL DERMABOND .7 (GAUZE/BANDAGES/DRESSINGS) ×1
APL SKNCLS STERI-STRIP NONHPOA (GAUZE/BANDAGES/DRESSINGS)
APL SWBSTK 6 STRL LF DISP (MISCELLANEOUS)
APPLICATOR COTTON TIP 6 STRL (MISCELLANEOUS) IMPLANT
APPLICATOR COTTON TIP 6IN STRL (MISCELLANEOUS)
APPLIER CLIP 5 13 M/L LIGAMAX5 (MISCELLANEOUS)
APPLIER CLIP ROT 10 11.4 M/L (STAPLE)
APR CLP MED LRG 11.4X10 (STAPLE)
APR CLP MED LRG 5 ANG JAW (MISCELLANEOUS)
BAG SPEC RTRVL 10 TROC 200 (ENDOMECHANICALS) ×1
BENZOIN TINCTURE PRP APPL 2/3 (GAUZE/BANDAGES/DRESSINGS) IMPLANT
CABLE HIGH FREQUENCY MONO STRZ (ELECTRODE) IMPLANT
CATH REDDICK CHOLANGI 4FR 50CM (CATHETERS) ×2 IMPLANT
CLIP APPLIE 5 13 M/L LIGAMAX5 (MISCELLANEOUS) IMPLANT
CLIP APPLIE ROT 10 11.4 M/L (STAPLE) IMPLANT
COVER MAYO STAND STRL (DRAPES) ×2 IMPLANT
COVER SURGICAL LIGHT HANDLE (MISCELLANEOUS) ×2 IMPLANT
COVER WAND RF STERILE (DRAPES) IMPLANT
DECANTER SPIKE VIAL GLASS SM (MISCELLANEOUS) IMPLANT
DERMABOND ADVANCED (GAUZE/BANDAGES/DRESSINGS) ×1
DERMABOND ADVANCED .7 DNX12 (GAUZE/BANDAGES/DRESSINGS) ×1 IMPLANT
DRAPE C-ARM 42X120 X-RAY (DRAPES) ×2 IMPLANT
ELECT L-HOOK LAP 45CM DISP (ELECTROSURGICAL) ×2
ELECT PENCIL ROCKER SW 15FT (MISCELLANEOUS) ×2 IMPLANT
ELECT REM PT RETURN 15FT ADLT (MISCELLANEOUS) ×2 IMPLANT
ELECTRODE L-HOOK LAP 45CM DISP (ELECTROSURGICAL) ×1 IMPLANT
GLOVE BIOGEL M 8.0 STRL (GLOVE) ×2 IMPLANT
GOWN STRL REUS W/TWL XL LVL3 (GOWN DISPOSABLE) ×2 IMPLANT
GRASPER SUT TROCAR 14GX15 (MISCELLANEOUS) ×2 IMPLANT
HEMOSTAT SURGICEL 4X8 (HEMOSTASIS) IMPLANT
IV CATH 14GX2 1/4 (CATHETERS) ×2 IMPLANT
KIT BASIN OR (CUSTOM PROCEDURE TRAY) ×2 IMPLANT
KIT TURNOVER KIT A (KITS) ×2 IMPLANT
POUCH RETRIEVAL ECOSAC 10 (ENDOMECHANICALS) ×1 IMPLANT
POUCH RETRIEVAL ECOSAC 10MM (ENDOMECHANICALS) ×1
SCISSORS LAP 5X45 EPIX DISP (ENDOMECHANICALS) ×2 IMPLANT
SET IRRIG TUBING LAPAROSCOPIC (IRRIGATION / IRRIGATOR) ×2 IMPLANT
SET TUBE SMOKE EVAC HIGH FLOW (TUBING) ×2 IMPLANT
SLEEVE XCEL OPT CAN 5 100 (ENDOMECHANICALS) ×4 IMPLANT
STRIP CLOSURE SKIN 1/2X4 (GAUZE/BANDAGES/DRESSINGS) IMPLANT
SUT MNCRL AB 4-0 PS2 18 (SUTURE) ×4 IMPLANT
SYR 20ML LL LF (SYRINGE) ×2 IMPLANT
TOWEL OR 17X26 10 PK STRL BLUE (TOWEL DISPOSABLE) ×2 IMPLANT
TRAY LAPAROSCOPIC (CUSTOM PROCEDURE TRAY) ×2 IMPLANT
TROCAR BLADELESS OPT 5 100 (ENDOMECHANICALS) ×2 IMPLANT
TROCAR XCEL BLUNT TIP 100MML (ENDOMECHANICALS) IMPLANT
TROCAR XCEL NON-BLD 11X100MML (ENDOMECHANICALS) ×2 IMPLANT

## 2020-12-31 NOTE — Transfer of Care (Signed)
Immediate Anesthesia Transfer of Care Note  Patient: James Williams  Procedure(s) Performed: LAPAROSCOPIC CHOLECYSTECTOMY WITH INTRAOPERATIVE CHOLANGIOGRAM (N/A Abdomen)  Patient Location: PACU  Anesthesia Type: General  Level of Consciousness: awake, alert  and patient cooperative  Airway & Oxygen Therapy: Patient Spontanous Breathing and Patient connected to nasal cannula oxygen  Post-op Assessment: Report given to RN and Post -op Vital signs reviewed and stable  Post vital signs: Reviewed and stable  Last Vitals:  Vitals Value Taken Time  BP    Temp    Pulse    Resp    SpO2      Last Pain:  Vitals:   12/31/20 0629  TempSrc: Oral  PainSc:          Complications: No complications documented.

## 2020-12-31 NOTE — Op Note (Signed)
James Williams  Primary Care Physician:  Richardean Chimera, MD    12/31/2020  9:18 AM  Procedure: Laparoscopic Cholecystectomy with intraoperative cholangiogram  Surgeon: Susy Frizzle B. Daphine Deutscher, MD, FACS Asst:  none  Anes:  General  Drains:  None  Findings: Severe chronic cholecystitis with GB inshrouded with adhesions to surrounding omentum and large gallstone.  Normal IOC  Description of Procedure: The patient was taken to OR 5 and given general anesthesia.  The patient was prepped with chlorohexidine prep and draped sterilely. A time out was performed including identifying the patient and discussing their procedure.  Access to the abdomen was achieved with a 5 mm Optiview through the right upper quadrant.  Port placement included three 5 mm trocars and one 11 mm trocar.    The gallbladder was visualized and appeared fused with the surrounding omentum.   The fundus of the gallbaldder was grasped and the gallbladder was elevated.  The adhesions were taken down with scissors and electrocautery.  Traction on the infundibulum allowed for successful demonstration of the critical view. Inflammatory changes were chronic and dense.  The cystic duct was identified and clipped up on the gallbladder and an incision was made in the cystic duct and the Reddick catheter was inserted after milking the cystic duct of any debris. A dynamic cholangiogram was performed which demonstrated a long cystic duct and intra and extrahepatic filling with flow into the duodenum.    The cystic duct was then triple clipped and divided, the cystic artery was double clipped and divided and then the gallbladder was removed from the gallbladder bed. Removal of the gallbladder from the gallbladder bed was performed with some drainage.  The gallbladder was then placed in a bag and brought out through one of the trocar sites. The gallbladder bed was inspected and no bleeding or bile leaks were seen.   Laparoscopic visualization was used  when closing fascial defects for trocar sites.   Incisions were injected with Exparel and closed with 4-0 Monocryl and Dermabond on the skin.  Sponge and needle count were correct.    The patient was taken to the recovery room in satisfactory condition.

## 2020-12-31 NOTE — Interval H&P Note (Signed)
History and Physical Interval Note:  12/31/2020 7:24 AM  James Williams  has presented today for surgery, with the diagnosis of GALLSTONES/ CHRONIC CHOLECYSTITIS.  The various methods of treatment have been discussed with the patient and family. After consideration of risks, benefits and other options for treatment, the patient has consented to  Procedure(s): LAPAROSCOPIC CHOLECYSTECTOMY WITH INTRAOPERATIVE CHOLANGIOGRAM (N/A) as a surgical intervention.  The patient's history has been reviewed, patient examined, no change in status, stable for surgery.  I have reviewed the patient's chart and labs.  Questions were answered to the patient's satisfaction.     Valarie Merino

## 2020-12-31 NOTE — Anesthesia Postprocedure Evaluation (Signed)
Anesthesia Post Note  Patient: James Williams  Procedure(s) Performed: LAPAROSCOPIC CHOLECYSTECTOMY WITH INTRAOPERATIVE CHOLANGIOGRAM (N/A Abdomen)     Patient location during evaluation: PACU Anesthesia Type: General Level of consciousness: awake and alert Pain management: pain level controlled Vital Signs Assessment: post-procedure vital signs reviewed and stable Respiratory status: spontaneous breathing, nonlabored ventilation, respiratory function stable and patient connected to nasal cannula oxygen Cardiovascular status: blood pressure returned to baseline and stable Postop Assessment: no apparent nausea or vomiting Anesthetic complications: no   No complications documented.  Last Vitals:  Vitals:   12/31/20 1100 12/31/20 1120  BP: 140/80   Pulse: 65   Resp: 19 (!) 22  Temp:  36.6 C  SpO2: 95%     Last Pain:  Vitals:   12/31/20 1120  TempSrc:   PainSc: 3                  Joclynn Lumb P Kevron Patella

## 2020-12-31 NOTE — H&P (Signed)
Chief Complaint:  gallstones  History of Present Illness:  James Williams is an 35 y.o. male with morning right upper quadrant pain that occurs before eating.  When he lies on his right side he cannot lie there for more than 5 minutes.  Pain and nausea ensue.    For lap chole this AM.      Past Medical History:  Diagnosis Date  . Allergy to alpha-gal    Mammal meat allergy    Past Surgical History:  Procedure Laterality Date  . WISDOM TOOTH EXTRACTION      Current Facility-Administered Medications  Medication Dose Route Frequency Provider Last Rate Last Admin  . bupivacaine liposome (EXPAREL) 1.3 % injection 266 mg  20 mL Infiltration Once Luretha Murphy, MD      . ceFAZolin (ANCEF) IVPB 2g/100 mL premix  2 g Intravenous On Call to OR Luretha Murphy, MD      . Chlorhexidine Gluconate Cloth 2 % PADS 6 each  6 each Topical Once Luretha Murphy, MD       And  . Chlorhexidine Gluconate Cloth 2 % PADS 6 each  6 each Topical Once Luretha Murphy, MD      . lactated ringers infusion   Intravenous Continuous Elmer Picker, MD 10 mL/hr at 12/31/20 0645 Continued from Pre-op at 12/31/20 0645  . scopolamine (TRANSDERM-SCOP) 1 MG/3DAYS 1.5 mg  1 patch Transdermal On Call to OR Luretha Murphy, MD   1.5 mg at 12/31/20 0602   Beef-derived products, Lambs quarters, Other, and Pork-derived products History reviewed. No pertinent family history. Social History:   reports that he has never smoked. He has never used smokeless tobacco. He reports current drug use. Drug: Marijuana. He reports that he does not drink alcohol.   REVIEW OF SYSTEMS : Negative except for Star Tick allergy  Physical Exam:   Blood pressure 132/76, pulse 73, temperature 98.8 F (37.1 C), temperature source Oral, resp. rate 18, weight 109.8 kg, SpO2 100 %. Body mass index is 31.93 kg/m.  Gen:  WDWN WM NAD  Neurological: Alert and oriented to person, place, and time. Motor and sensory function is grossly intact  Head:  Normocephalic and atraumatic.  Eyes: Conjunctivae are normal. Pupils are equal, round, and reactive to light. No scleral icterus.  Neck: Normal range of motion. Neck supple. No tracheal deviation or thyromegaly present.  Cardiovascular:  SR without murmurs or gallops.  No carotid bruits Breast:  Not examined Respiratory: Effort normal.  No respiratory distress. No chest wall tenderness. Breath sounds normal.  No wheezes, rales or rhonchi.  Abdomen:  Non tender at present GU: unremarkable Musculoskeletal: Normal range of motion. Extremities are nontender. No cyanosis, edema or clubbing noted Lymphadenopathy: No cervical, preauricular, postauricular or axillary adenopathy is present Skin: Skin is warm and dry. No rash noted. No diaphoresis. No erythema. No pallor. Pscyh: Normal mood and affect. Behavior is normal. Judgment and thought content normal.   LABORATORY RESULTS: Results for orders placed or performed during the hospital encounter of 12/30/20 (from the past 48 hour(s))  CBC per protocol     Status: None   Collection Time: 12/30/20  1:30 PM  Result Value Ref Range   WBC 7.7 4.0 - 10.5 K/uL   RBC 5.59 4.22 - 5.81 MIL/uL   Hemoglobin 16.9 13.0 - 17.0 g/dL   HCT 05.3 97.6 - 73.4 %   MCV 89.4 80.0 - 100.0 fL   MCH 30.2 26.0 - 34.0 pg   MCHC 33.8 30.0 -  36.0 g/dL   RDW 16.1 09.6 - 04.5 %   Platelets 219 150 - 400 K/uL   nRBC 0.0 0.0 - 0.2 %    Comment: Performed at [facility], [address], [place], [place] [address]  Basic metabolic panel per protocol     Status: Abnormal   Collection Time: 12/30/20  1:30 PM  Result Value Ref Range   Sodium 140 135 - 145 mmol/L   Potassium 3.9 3.5 - 5.1 mmol/L   Chloride 106 98 - 111 mmol/L   CO2 26 22 - 32 mmol/L   Glucose, Bld 102 (H) 70 - 99 mg/dL    Comment: Glucose reference range applies only to samples taken after fasting for at least 8 hours.   BUN 14 6 - 20 mg/dL   Creatinine, Ser 1.91 0.61 - 1.24 mg/dL    Calcium 9.3 8.9 - 47.8 mg/dL   GFR, Estimated >29 >56 mL/min    Comment: (NOTE) Calculated using the CKD-EPI Creatinine Equation (2021)    Anion gap 8 5 - 15    Comment: Performed at [facility], [address], [place], [place] [address]     RADIOLOGY RESULTS: No results found.  Problem List: Patient Active Problem List   Diagnosis Date Noted  . Gallstones-1.9 cm  12/31/2020    Assessment & Plan: Atypical right upper quadrant pain with gallstones.  For lap chole    Matt B. Daphine Deutscher, MD, [facility] [phone] beeper [phone]  12/31/2020 7:20 AM

## 2020-12-31 NOTE — Discharge Instructions (Signed)
General Anesthesia, Adult, Care After This sheet gives you information about how to care for yourself after your procedure. Your health care provider may also give you more specific instructions. If you have problems or questions, contact your health care provider. What can I expect after the procedure? After the procedure, the following side effects are common:  Pain or discomfort at the IV site.  Nausea.  Vomiting.  Sore throat.  Trouble concentrating.  Feeling cold or chills.  Feeling weak or tired.  Sleepiness and fatigue.  Soreness and body aches. These side effects can affect parts of the body that were not involved in surgery. Follow these instructions at home: For the time period you were told by your health care provider:  Rest.  Do not participate in activities where you could fall or become injured.  Do not drive or use machinery.  Do not drink alcohol.  Do not take sleeping pills or medicines that cause drowsiness.  Do not make important decisions or sign legal documents.  Do not take care of children on your own.   Eating and drinking  Follow any instructions from your health care provider about eating or drinking restrictions.  When you feel hungry, start by eating small amounts of foods that are soft and easy to digest (bland), such as toast. Gradually return to your regular diet.  Drink enough fluid to keep your urine pale yellow.  If you vomit, rehydrate by drinking water, juice, or clear broth. General instructions  If you have sleep apnea, surgery and certain medicines can increase your risk for breathing problems. Follow instructions from your health care provider about wearing your sleep device: ? Anytime you are sleeping, including during daytime naps. ? While taking prescription pain medicines, sleeping medicines, or medicines that make you drowsy.  Have a responsible adult stay with you for the time you are told. It is important to have  someone help care for you until you are awake and alert.  Return to your normal activities as told by your health care provider. Ask your health care provider what activities are safe for you.  Take over-the-counter and prescription medicines only as told by your health care provider.  If you smoke, do not smoke without supervision.  Keep all follow-up visits as told by your health care provider. This is important. Contact a health care provider if:  You have nausea or vomiting that does not get better with medicine.  You cannot eat or drink without vomiting.  You have pain that does not get better with medicine.  You are unable to pass urine.  You develop a skin rash.  You have a fever.  You have redness around your IV site that gets worse. Get help right away if:  You have difficulty breathing.  You have chest pain.  You have blood in your urine or stool, or you vomit blood. Summary  After the procedure, it is common to have a sore throat or nausea. It is also common to feel tired.  Have a responsible adult stay with you for the time you are told. It is important to have someone help care for you until you are awake and alert.  When you feel hungry, start by eating small amounts of foods that are soft and easy to digest (bland), such as toast. Gradually return to your regular diet.  Drink enough fluid to keep your urine pale yellow.  Return to your normal activities as told by your health care provider.   Ask your health care provider what activities are safe for you. This information is not intended to replace advice given to you by your health care provider. Make sure you discuss any questions you have with your health care provider. Document Revised: 06/25/2020 Document Reviewed: 01/23/2020 Elsevier Patient Education  2021 [employer]  Minimally Invasive Cholecystectomy Minimally invasive cholecystectomy is surgery to remove the gallbladder. The gallbladder is a  pear-shaped organ that lies beneath the liver on the right side of the body. The gallbladder stores bile, which is a fluid that helps the body digest fats. Cholecystectomy is often done to treat inflammation of the gallbladder (cholecystitis). This condition is usually caused by a buildup of gallstones (cholelithiasis) in the gallbladder. Gallstones can block the flow of bile, which can result in inflammation and pain. In severe cases, emergency surgery may be required. This procedure is done though small incisions in the abdomen, instead of one large incision. It is also called laparoscopic surgery. A thin scope with a camera (laparoscope) is inserted through one incision. Then surgical instruments are inserted through the other incisions. In some cases, a minimally invasive surgery may need to be changed to a surgery that is done through a larger incision. This is called open surgery. Tell a health care provider about:  Any allergies you have.  All medicines you are taking, including vitamins, herbs, eye drops, creams, and over-the-counter medicines.  Any problems you or family members have had with anesthetic medicines.  Any blood disorders you have.  Any surgeries you have had.  Any medical conditions you have.  Whether you are pregnant or may be pregnant. What are the risks? Generally, this is a safe procedure. However, problems may occur, including:  Infection.  Bleeding.  Allergic reactions to medicines.  Damage to nearby structures or organs.  A stone remaining in the common bile duct. The common bile duct carries bile from the gallbladder into the small intestine.  A bile leak from the cyst duct that is clipped when your gallbladder is removed. What happens before the procedure? Staying hydrated Follow instructions from your health care provider about hydration, which may include:  Up to 2 hours before the procedure - you may continue to drink clear liquids, such as water,  clear fruit juice, black coffee, and plain tea.   Eating and drinking restrictions Follow instructions from your health care provider about eating and drinking, which may include:  8 hours before the procedure - stop eating heavy meals or foods, such as meat, fried foods, or fatty foods.  6 hours before the procedure - stop eating light meals or foods, such as toast or cereal.  6 hours before the procedure - stop drinking milk or drinks that contain milk.  2 hours before the procedure - stop drinking clear liquids. Medicines Ask your health care provider about:  Changing or stopping your regular medicines. This is especially important if you are taking diabetes medicines or blood thinners.  Taking medicines such as aspirin and ibuprofen. These medicines can thin your blood. Do not take these medicines unless your health care provider tells you to take them.  Taking over-the-counter medicines, vitamins, herbs, and supplements. General instructions  Let your health care provider know if you develop a cold or an infection before surgery.  Plan to have someone take you home from the hospital or clinic.  If you will be going home right after the procedure, plan to have someone with you for 24 hours.  Ask your   health care provider: ? How your surgery site will be marked. ? What steps will be taken to help prevent infection. These may include:  Removing hair at the surgery site.  Washing skin with a germ-killing soap.  Taking antibiotic medicine. What happens during the procedure?  An IV will be inserted into one of your veins.  You will be given one or both of the following: ? A medicine to help you relax (sedative). ? A medicine to make you fall asleep (general anesthetic).  A breathing tube will be placed in your mouth.  Your surgeon will make several small incisions in your abdomen.  The laparoscope will be inserted through one of the small incisions. The camera on the  laparoscope will send images to a monitor in the operating room. This lets your surgeon see inside your abdomen.  A gas will be pumped into your abdomen. This will expand your abdomen to give the surgeon more room to perform the surgery.  Other tools that are needed for the procedure will be inserted through the other incisions. The gallbladder will be removed through one of the incisions.  Your common bile duct may be examined. If stones are found in the common bile duct, they may be removed.  After your gallbladder has been removed, the incisions will be closed with stitches (sutures), staples, or skin glue.  Your incisions may be covered with a bandage (dressing). The procedure may vary among health care providers and hospitals.   What happens after the procedure?  Your blood pressure, heart rate, breathing rate, and blood oxygen level will be monitored until you leave the hospital or clinic.  You will be given medicines as needed to control your pain.  If you were given a sedative during the procedure, it can affect you for several hours. Do not drive or operate machinery until your health care provider says that it is safe. Summary  Minimally invasive cholecystectomy, also called laparoscopic cholecystectomy, is surgery to remove the gallbladder using small incisions.  Tell your health care provider about all the medical conditions you have and all the medicines you are taking for those conditions.  Before the procedure, follow instructions about eating or drinking restrictions and changing or stopping medicines.  If you were given a sedative during the procedure, it can affect you for several hours. Do not drive or operate machinery until your health care provider says that it is safe. This information is not intended to replace advice given to you by your health care provider. Make sure you discuss any questions you have with your health care provider. Document Revised: 07/15/2019  Document Reviewed: 07/15/2019 Elsevier Patient Education  2021 [employer] Inc.  

## 2020-12-31 NOTE — Anesthesia Procedure Notes (Signed)
Procedure Name: Intubation Date/Time: 12/31/2020 7:40 AM Performed by: Lavina Hamman, CRNA Pre-anesthesia Checklist: Patient identified, Emergency Drugs available, Suction available, Patient being monitored and Timeout performed Patient Re-evaluated:Patient Re-evaluated prior to induction Oxygen Delivery Method: Circle system utilized Preoxygenation: Pre-oxygenation with 100% oxygen Induction Type: IV induction and Rapid sequence Ventilation: Mask ventilation without difficulty and Two handed mask ventilation required Laryngoscope Size: Mac and 4 Grade View: Grade II Tube type: Oral Tube size: 7.5 mm Number of attempts: 1 Airway Equipment and Method: Stylet Placement Confirmation: ETT inserted through vocal cords under direct vision,  positive ETCO2 and breath sounds checked- equal and bilateral Secured at: 24 cm Tube secured with: Tape Dental Injury: Teeth and Oropharynx as per pre-operative assessment

## 2021-01-01 ENCOUNTER — Encounter (HOSPITAL_COMMUNITY): Payer: Self-pay | Admitting: Surgery

## 2021-01-01 LAB — SURGICAL PATHOLOGY

## 2021-01-20 ENCOUNTER — Other Ambulatory Visit (HOSPITAL_COMMUNITY): Payer: No Typology Code available for payment source

## 2021-01-22 ENCOUNTER — Other Ambulatory Visit (HOSPITAL_COMMUNITY): Payer: No Typology Code available for payment source

## 2021-01-25 ENCOUNTER — Other Ambulatory Visit (HOSPITAL_COMMUNITY): Payer: No Typology Code available for payment source

## 2021-01-25 ENCOUNTER — Encounter (HOSPITAL_COMMUNITY): Payer: Self-pay | Admitting: Surgery

## 2021-03-05 ENCOUNTER — Encounter (INDEPENDENT_AMBULATORY_CARE_PROVIDER_SITE_OTHER): Payer: Self-pay | Admitting: *Deleted

## 2021-05-25 ENCOUNTER — Encounter (INDEPENDENT_AMBULATORY_CARE_PROVIDER_SITE_OTHER): Payer: Self-pay

## 2021-05-25 ENCOUNTER — Ambulatory Visit (INDEPENDENT_AMBULATORY_CARE_PROVIDER_SITE_OTHER): Payer: No Typology Code available for payment source | Admitting: Internal Medicine

## 2021-05-25 ENCOUNTER — Other Ambulatory Visit (HOSPITAL_COMMUNITY): Payer: Self-pay

## 2021-05-25 ENCOUNTER — Encounter (INDEPENDENT_AMBULATORY_CARE_PROVIDER_SITE_OTHER): Payer: Self-pay | Admitting: Internal Medicine

## 2021-05-25 ENCOUNTER — Other Ambulatory Visit: Payer: Self-pay

## 2021-05-25 DIAGNOSIS — R195 Other fecal abnormalities: Secondary | ICD-10-CM | POA: Insufficient documentation

## 2021-05-25 DIAGNOSIS — R634 Abnormal weight loss: Secondary | ICD-10-CM | POA: Insufficient documentation

## 2021-05-25 DIAGNOSIS — R112 Nausea with vomiting, unspecified: Secondary | ICD-10-CM | POA: Diagnosis not present

## 2021-05-25 DIAGNOSIS — R55 Syncope and collapse: Secondary | ICD-10-CM | POA: Insufficient documentation

## 2021-05-25 MED ORDER — ONDANSETRON HCL 4 MG PO TABS: 4.0000 mg | ORAL_TABLET | Freq: Two times a day (BID) | ORAL | 1 refills | Status: DC | PRN

## 2021-05-25 MED ORDER — ONDANSETRON HCL 4 MG PO TABS
4.0000 mg | ORAL_TABLET | Freq: Two times a day (BID) | ORAL | 1 refills | Status: DC | PRN
  Filled 2021-05-25: qty 30, 15d supply, fill #0

## 2021-05-25 MED ORDER — HYOSCYAMINE SULFATE 0.125 MG SL SUBL
0.1250 mg | SUBLINGUAL_TABLET | SUBLINGUAL | 2 refills | Status: DC
  Filled 2021-05-25: qty 60, 60d supply, fill #0

## 2021-05-25 MED ORDER — HYOSCYAMINE SULFATE 0.125 MG SL SUBL: 0.1250 mg | SUBLINGUAL_TABLET | SUBLINGUAL | 2 refills | Status: DC

## 2021-05-25 NOTE — Progress Notes (Signed)
Presenting complaint;  Recurrent spells of nausea vomiting abdominal discomfort gurgling and dizziness.  History of present illness  Patient is 35 year old Caucasian male who is referred through courtesy of Ms. Lorie Phenix, PA-C of [facility] for GI evaluation. Patient is accompanied by his wife James Williams who fills in with history.  Patient states he has been having these spells for about 4 years.  These spells occur every morning as when he voids.  He feels bloated and notices gurgling across his upper abdomen he feels nauseated and dizzy and lightheaded.  Vomiting does not occur each time but can occur couple of times in a month.  Early on he was having 3-4 spells per week but lately he is having almost daily.  He feels very weak and he has to go lie down for about an hour before he feels better.  He also complains of vertigo particularly when he lies on the right side.  He has been prescribed meclizine in the past but not sure if he has used it.  According to his wife he has had poor appetite.  She has to remind him at lunchtime to eat.  He has been losing weight over the last 1 year.  He has lost over 40 pounds.  They both feel that some of the weight loss is voluntary but most of it is involuntary.  Review of his records reveal that he weighed 256 pounds on 04/12/2021 and today he weighs 241 pounds. These episodes are generally not associated with defecation.  On most days he has 1 or 2 formed stools but every now and then he may have a large explosive bowel movement.  No history of hematemesis melena or rectal bleeding. He had abdominal ultrasound in June 2021 which revealed cholelithiasis.  He went on to have laparoscopic cholecystectomy on March this year.  He states he is not experiencing sharp pain anymore.  However other symptoms have not changed any. He feels as if he has a bruise in his mid abdomen. Review of the systems is negative for fever chills or night sweats.  He has no  difficulty sleeping.  He also does not have any difficulty voiding. 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  He stopped eating meat for 3-month and the test was negative and he had tick bite again and and once again stopped eating meat and he had a negative test in May this year.  Change his diet has not had any effect on his symptoms. Review of the systems is negative for skin rash or shortness of breath.  He drinks alcohol no more than 1 or 2 drinks per month.   Current Medications: Outpatient Encounter Medications as of 05/25/2021  Medication Sig   AUVI-Q 0.3 MG/0.3ML SOAJ injection Use as directed for life-threatening allergic reaction. (Patient taking differently: Inject 0.3 mg into the muscle as needed. Use as directed for life-threatening allergic reaction.)   HYDROcodone-acetaminophen (NORCO/VICODIN) 5-325 MG tablet Take 1 tablet by mouth every 6 (six) hours as needed for moderate pain.   HYDROcodone-acetaminophen (NORCO/VICODIN) 5-325 MG tablet TAKE 1 TABLET BY MOUTH EVERY 6 (SIX) HOURS AS NEEDED FOR MODERATE PAIN.   OVER THE COUNTER MEDICATION Take 1-2 drops by mouth daily as needed (pain). CBD   No facility-administered encounter medications on file as of 05/25/2021.   Past medical history  Obesity.  Peak weight 315 pounds at age 38.  He weighed 285 pounds 1 year ago.  Wisdom teeth extracted at age  19.  Laparoscopic cholecystectomy in March 2022 for cholelithiasis.  Allergies  Allergies  Allergen Reactions   Beef-Derived Products     Alpha Gal   Lambs Quarters     Alpha Gal   Other     Gelatin - Alpha gal    Pork-Derived Products     Alpha Gal     Family history  Both parents are in good health.  Father is 48 years old and mother is 74. He has 1 sister age 23 who has ulcerative colitis and is doing well with therapy.  Social history  Patient is married.  He is accompanied by his wife today.  They would son age 72 who had seizure disorder and strokes soon  after he was born he is not taking anticonvulsants anymore he has some developmental issues but doing much better than he has been. Patient works for a [employer].  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  He has never smoked cigarettes.  He may have 1 or 2 drinks of alcohol per month and normal. He walks 1-1/2 to 3 miles every day.  Physical examination  Blood pressure 132/71, pulse 69, temperature 99 F (37.2 C), temperature source Oral, height 6\' 2"  (1.88 m), weight 241 lb (109.3 kg). Patient is alert and in no acute distress. He is wearing a mask. Conjunctiva is pink. Sclera is nonicteric Oropharyngeal mucosa is normal. No neck masses or thyromegaly noted. Cardiac exam with regular rhythm normal S1 and S2. No murmur or gallop noted. Lungs are clear to auscultation. Abdomen is full.  Is symmetrical.  Laparoscopy scars noted across upper abdomen.  Bowel sounds are normal.  On palpation abdomen is soft.  Mild epigastric/periumbilical tenderness noted.  No organomegaly or masses. Rectal examination reveals brown but heme positive stool.  Limited examination of prostate gland.  Lower half without tenderness or nodules. No LE edema or clubbing noted.  Labs/studies Results:   CBC Latest Ref Rng & Units 12/30/2020  WBC 4.0 - 10.5 K/uL 7.7  Hemoglobin 13.0 - 17.0 g/dL 03/01/2021  Hematocrit 16.1 - 52.0 % 50.0  Platelets 150 - 400 K/uL 219    CMP Latest Ref Rng & Units 12/30/2020  Glucose 70 - 99 mg/dL 03/01/2021)  BUN 6 - 20 mg/dL 14  Creatinine 045(W - 0.98 mg/dL 1.19  Sodium 1.47 - 829 mmol/L 140  Potassium 3.5 - 5.1 mmol/L 3.9  Chloride 98 - 111 mmol/L 106  CO2 22 - 32 mmol/L 26  Calcium 8.9 - 10.3 mg/dL 9.3    No recent LFTs on file.  Assessment:  #1.  Patient is 34 year old Caucasian male who presents with over 3 to 4-year history of abdominal gurgling nausea and vomiting soon after he voids every morning.  Other associated symptoms  include dizziness as well as vertigo.  The symptoms improve with rest for 1 hour and do not occur rest of the day.  Recent cholecystectomy only helped to relieve sharp pain but not other symptoms.  The symptoms are suggestive of vasovagal syndrome.  He has not had syncope.  Given history of vomiting food that he had eaten several hours earlier raises possibility of gastroparesis or peptic ulcer disease.  He also does not have good appetite and has been losing weight.  All of the symptoms put together are worrisome and warrant further investigation.  #2.  Heme positive stool.  No history of hematemesis melena or rectal bleeding.  Source could be upper  or lower GI.  #3.  Weight loss.  He has lost over 40 pounds in last 1 year.  Some of the weight loss may be voluntary.   Recommendations  Patient will go to the lab for CBC with differential and comprehensive chemistry panel. Hyoscyamine sublingual 0.125 mg, 1 to 2  tablets as soon as he wakes up every morning. Ondansetron 4 mg p.o. 3 times daily as needed. Diagnostic esophagogastroduodenoscopy and colonoscopy to be performed in near future under monitored anesthesia care. I reviewed both the procedure risk with the patient and he is agreeable. Further recommendations to follow.

## 2021-05-25 NOTE — Patient Instructions (Signed)
Physician will call with results of blood test when completed. Esophagogastroduodenoscopy and colonoscopy to be scheduled. 1 to 2 tablets of hyoscyamine sublingual every morning as directed.

## 2021-05-25 NOTE — H&P (View-Only) (Signed)
Presenting complaint;  Recurrent spells of nausea vomiting abdominal discomfort gurgling and dizziness.  History of present illness  Patient is 35 year old Caucasian male who is referred through courtesy of Ms. Lorie Phenix, PA-C of [facility] for GI evaluation. Patient is accompanied by his wife Lance Bosch who fills in with history.  Patient states he has been having these spells for about 4 years.  These spells occur every morning as when he voids.  He feels bloated and notices gurgling across his upper abdomen he feels nauseated and dizzy and lightheaded.  Vomiting does not occur each time but can occur couple of times in a month.  Early on he was having 3-4 spells per week but lately he is having almost daily.  He feels very weak and he has to go lie down for about an hour before he feels better.  He also complains of vertigo particularly when he lies on the right side.  He has been prescribed meclizine in the past but not sure if he has used it.  According to his wife he has had poor appetite.  She has to remind him at lunchtime to eat.  He has been losing weight over the last 1 year.  He has lost over 40 pounds.  They both feel that some of the weight loss is voluntary but most of it is involuntary.  Review of his records reveal that he weighed 256 pounds on 04/12/2021 and today he weighs 241 pounds. These episodes are generally not associated with defecation.  On most days he has 1 or 2 formed stools but every now and then he may have a large explosive bowel movement.  No history of hematemesis melena or rectal bleeding. He had abdominal ultrasound in June 2021 which revealed cholelithiasis.  He went on to have laparoscopic cholecystectomy on March this year.  He states he is not experiencing sharp pain anymore.  However other symptoms have not changed any. He feels as if he has a bruise in his mid abdomen. Review of the systems is negative for fever chills or night sweats.  He has no  difficulty sleeping.  He also does not have any difficulty voiding. 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  He stopped eating meat for 3-month and the test was negative and he had tick bite again and and once again stopped eating meat and he had a negative test in May this year.  Change his diet has not had any effect on his symptoms. Review of the systems is negative for skin rash or shortness of breath.  He drinks alcohol no more than 1 or 2 drinks per month.   Current Medications: Outpatient Encounter Medications as of 05/25/2021  Medication Sig   AUVI-Q 0.3 MG/0.3ML SOAJ injection Use as directed for life-threatening allergic reaction. (Patient taking differently: Inject 0.3 mg into the muscle as needed. Use as directed for life-threatening allergic reaction.)   HYDROcodone-acetaminophen (NORCO/VICODIN) 5-325 MG tablet Take 1 tablet by mouth every 6 (six) hours as needed for moderate pain.   HYDROcodone-acetaminophen (NORCO/VICODIN) 5-325 MG tablet TAKE 1 TABLET BY MOUTH EVERY 6 (SIX) HOURS AS NEEDED FOR MODERATE PAIN.   OVER THE COUNTER MEDICATION Take 1-2 drops by mouth daily as needed (pain). CBD   No facility-administered encounter medications on file as of 05/25/2021.   Past medical history  Obesity.  Peak weight 315 pounds at age 38.  He weighed 285 pounds 1 year ago.  Wisdom teeth extracted at age  19.  Laparoscopic cholecystectomy in March 2022 for cholelithiasis.  Allergies  Allergies  Allergen Reactions   Beef-Derived Products     Alpha Gal   Lambs Quarters     Alpha Gal   Other     Gelatin - Alpha gal    Pork-Derived Products     Alpha Gal     Family history  Both parents are in good health.  Father is 48 years old and mother is 74. He has 1 sister age 23 who has ulcerative colitis and is doing well with therapy.  Social history  Patient is married.  He is accompanied by his wife today.  They would son age 72 who had seizure disorder and strokes soon  after he was born he is not taking anticonvulsants anymore he has some developmental issues but doing much better than he has been. Patient works for a [employer].  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  He has never smoked cigarettes.  He may have 1 or 2 drinks of alcohol per month and normal. He walks 1-1/2 to 3 miles every day.  Physical examination  Blood pressure 132/71, pulse 69, temperature 99 F (37.2 C), temperature source Oral, height 6\' 2"  (1.88 m), weight 241 lb (109.3 kg). Patient is alert and in no acute distress. He is wearing a mask. Conjunctiva is pink. Sclera is nonicteric Oropharyngeal mucosa is normal. No neck masses or thyromegaly noted. Cardiac exam with regular rhythm normal S1 and S2. No murmur or gallop noted. Lungs are clear to auscultation. Abdomen is full.  Is symmetrical.  Laparoscopy scars noted across upper abdomen.  Bowel sounds are normal.  On palpation abdomen is soft.  Mild epigastric/periumbilical tenderness noted.  No organomegaly or masses. Rectal examination reveals brown but heme positive stool.  Limited examination of prostate gland.  Lower half without tenderness or nodules. No LE edema or clubbing noted.  Labs/studies Results:   CBC Latest Ref Rng & Units 12/30/2020  WBC 4.0 - 10.5 K/uL 7.7  Hemoglobin 13.0 - 17.0 g/dL 03/01/2021  Hematocrit 16.1 - 52.0 % 50.0  Platelets 150 - 400 K/uL 219    CMP Latest Ref Rng & Units 12/30/2020  Glucose 70 - 99 mg/dL 03/01/2021)  BUN 6 - 20 mg/dL 14  Creatinine 045(W - 0.98 mg/dL 1.19  Sodium 1.47 - 829 mmol/L 140  Potassium 3.5 - 5.1 mmol/L 3.9  Chloride 98 - 111 mmol/L 106  CO2 22 - 32 mmol/L 26  Calcium 8.9 - 10.3 mg/dL 9.3    No recent LFTs on file.  Assessment:  #1.  Patient is 34 year old Caucasian male who presents with over 3 to 4-year history of abdominal gurgling nausea and vomiting soon after he voids every morning.  Other associated symptoms  include dizziness as well as vertigo.  The symptoms improve with rest for 1 hour and do not occur rest of the day.  Recent cholecystectomy only helped to relieve sharp pain but not other symptoms.  The symptoms are suggestive of vasovagal syndrome.  He has not had syncope.  Given history of vomiting food that he had eaten several hours earlier raises possibility of gastroparesis or peptic ulcer disease.  He also does not have good appetite and has been losing weight.  All of the symptoms put together are worrisome and warrant further investigation.  #2.  Heme positive stool.  No history of hematemesis melena or rectal bleeding.  Source could be upper  or lower GI.  #3.  Weight loss.  He has lost over 40 pounds in last 1 year.  Some of the weight loss may be voluntary.   Recommendations  Patient will go to the lab for CBC with differential and comprehensive chemistry panel. Hyoscyamine sublingual 0.125 mg, 1 to 2  tablets as soon as he wakes up every morning. Ondansetron 4 mg p.o. 3 times daily as needed. Diagnostic esophagogastroduodenoscopy and colonoscopy to be performed in near future under monitored anesthesia care. I reviewed both the procedure risk with the patient and he is agreeable. Further recommendations to follow.

## 2021-05-26 ENCOUNTER — Other Ambulatory Visit (HOSPITAL_COMMUNITY): Payer: Self-pay

## 2021-05-26 ENCOUNTER — Telehealth (INDEPENDENT_AMBULATORY_CARE_PROVIDER_SITE_OTHER): Payer: Self-pay | Admitting: *Deleted

## 2021-05-26 ENCOUNTER — Other Ambulatory Visit (INDEPENDENT_AMBULATORY_CARE_PROVIDER_SITE_OTHER): Payer: Self-pay

## 2021-05-26 DIAGNOSIS — R112 Nausea with vomiting, unspecified: Secondary | ICD-10-CM

## 2021-05-26 DIAGNOSIS — R195 Other fecal abnormalities: Secondary | ICD-10-CM

## 2021-05-26 DIAGNOSIS — R55 Syncope and collapse: Secondary | ICD-10-CM

## 2021-05-26 NOTE — Telephone Encounter (Signed)
Pt seen 8/2. EGD and colonoscopy scheduled at visit. Pt also asked about if a emptying study was going to be done. Wasn't sure if he was told it would be or not.

## 2021-05-26 NOTE — Telephone Encounter (Signed)
Per dr Karilyn Cota. EGD needs to be done first and then empyting study if needed can be ordered. Called and discussed with pt. Pt verbalized understanding.

## 2021-05-28 ENCOUNTER — Other Ambulatory Visit (INDEPENDENT_AMBULATORY_CARE_PROVIDER_SITE_OTHER): Payer: Self-pay

## 2021-05-28 NOTE — Patient Instructions (Signed)
James Williams  05/28/2021     @PREFPERIOPPHARMACY @   Your procedure is scheduled on  06/02/2021.   Report to [facility] at  1200  P.M.   Call this number if you have problems the morning of surgery:  [phone]   Remember:  Follow the diet and prep instructions given to you by the office.    Take these medicines the morning of surgery with A SIP OF WATER                                             None      Do not wear jewelry, make-up or nail polish.  Do not wear lotions, powders, or perfumes, or deodorant.  Do not shave 48 hours prior to surgery.  Men may shave face and neck.  Do not bring valuables to the hospital.  [facility] is not responsible for any belongings or valuables.  Contacts, dentures or bridgework may not be worn into surgery.  Leave your suitcase in the car.  After surgery it may be brought to your room.  For patients admitted to the hospital, discharge time will be determined by your treatment team.  Patients discharged the day of surgery will not be allowed to drive home and must have someone with them for 24 hours.    Special instructions:   DO NOT smoke tobacco or vape for 24 hours before your procedure.  Please read over the following fact sheets that you were given. Anesthesia Post-op Instructions and Care and Recovery After Surgery      Upper Endoscopy, Adult, Care After This sheet gives you information about how to care for yourself after your procedure. Your health care provider may also give you more specific instructions. If you have problems or questions, contact your health careprovider. What can I expect after the procedure? After the procedure, it is common to have: A sore throat. Mild stomach pain or discomfort. Bloating. Nausea. Follow these instructions at home:  Follow instructions from your health care provider about what to eat or drink after your procedure. Return to your normal activities as told by your  health care provider. Ask your health care provider what activities are safe for you. Take over-the-counter and prescription medicines only as told by your health care provider. If you were given a sedative during the procedure, it can affect you for several hours. Do not drive or operate machinery until your health care provider says that it is safe. Keep all follow-up visits as told by your health care provider. This is important. Contact a health care provider if you have: A sore throat that lasts longer than one day. Trouble swallowing. Get help right away if: You vomit blood or your vomit looks like coffee grounds. You have: A fever. Bloody, black, or tarry stools. A severe sore throat or you cannot swallow. Difficulty breathing. Severe pain in your chest or abdomen. Summary After the procedure, it is common to have a sore throat, mild stomach discomfort, bloating, and nausea. If you were given a sedative during the procedure, it can affect you for several hours. Do not drive or operate machinery until your health care provider says that it is safe. Follow instructions from your health care provider about what to eat or drink after your procedure. Return to your normal activities  as told by your health care provider. This information is not intended to replace advice given to you by your health care provider. Make sure you discuss any questions you have with your healthcare provider. Document Revised: 10/08/2019 Document Reviewed: 03/12/2018 Elsevier Patient Education  2022 [employer]. Colonoscopy, Adult, Care After This sheet gives you information about how to care for yourself after your procedure. Your health care provider may also give you more specific instructions. If you have problems or questions, contact your health careprovider. What can I expect after the procedure? After the procedure, it is common to have: A small amount of blood in your stool for 24 hours after the  procedure. Some gas. Mild cramping or bloating of your abdomen. Follow these instructions at home: Eating and drinking  Drink enough fluid to keep your urine pale yellow. Follow instructions from your health care provider about eating or drinking restrictions. Resume your normal diet as instructed by your health care provider. Avoid heavy or fried foods that are hard to digest.  Activity Rest as told by your health care provider. Avoid sitting for a long time without moving. Get up to take short walks every 1-2 hours. This is important to improve blood flow and breathing. Ask for help if you feel weak or unsteady. Return to your normal activities as told by your health care provider. Ask your health care provider what activities are safe for you. Managing cramping and bloating  Try walking around when you have cramps or feel bloated. Apply heat to your abdomen as told by your health care provider. Use the heat source that your health care provider recommends, such as a moist heat pack or a heating pad. Place a towel between your skin and the heat source. Leave the heat on for 20-30 minutes. Remove the heat if your skin turns bright red. This is especially important if you are unable to feel pain, heat, or cold. You may have a greater risk of getting burned.  General instructions If you were given a sedative during the procedure, it can affect you for several hours. Do not drive or operate machinery until your health care provider says that it is safe. For the first 24 hours after the procedure: Do not sign important documents. Do not drink alcohol. Do your regular daily activities at a slower pace than normal. Eat soft foods that are easy to digest. Take over-the-counter and prescription medicines only as told by your health care provider. Keep all follow-up visits as told by your health care provider. This is important. Contact a health care provider if: You have blood in your stool  2-3 days after the procedure. Get help right away if you have: More than a small spotting of blood in your stool. Large blood clots in your stool. Swelling of your abdomen. Nausea or vomiting. A fever. Increasing pain in your abdomen that is not relieved with medicine. Summary After the procedure, it is common to have a small amount of blood in your stool. You may also have mild cramping and bloating of your abdomen. If you were given a sedative during the procedure, it can affect you for several hours. Do not drive or operate machinery until your health care provider says that it is safe. Get help right away if you have a lot of blood in your stool, nausea or vomiting, a fever, or increased pain in your abdomen. This information is not intended to replace advice given to you by your health care  provider. Make sure you discuss any questions you have with your healthcare provider. Document Revised: 10/04/2019 Document Reviewed: 05/06/2019 Elsevier Patient Education  2022 Else[employer] Inc. Monitored Anesthesia Care, Care After This sheet gives you information about how to care for yourself after your procedure. Your health care provider may also give you more specific instructions. If you have problems or questions, contact your health careprovider. What can I expect after the procedure? After the procedure, it is common to have: Tiredness. Forgetfulness about what happened after the procedure. Impaired judgment for important decisions. Nausea or vomiting. Some difficulty with balance. Follow these instructions at home: For the time period you were told by your health care provider:     Rest as needed. Do not participate in activities where you could fall or become injured. Do not drive or use machinery. Do not drink alcohol. Do not take sleeping pills or medicines that cause drowsiness. Do not make important decisions or sign legal documents. Do not take care of children on your  own. Eating and drinking Follow the diet that is recommended by your health care provider. Drink enough fluid to keep your urine pale yellow. If you vomit: Drink water, juice, or soup when you can drink without vomiting. Make sure you have little or no nausea before eating solid foods. General instructions Have a responsible adult stay with you for the time you are told. It is important to have someone help care for you until you are awake and alert. Take over-the-counter and prescription medicines only as told by your health care provider. If you have sleep apnea, surgery and certain medicines can increase your risk for breathing problems. Follow instructions from your health care provider about wearing your sleep device: Anytime you are sleeping, including during daytime naps. While taking prescription pain medicines, sleeping medicines, or medicines that make you drowsy. Avoid smoking. Keep all follow-up visits as told by your health care provider. This is important. Contact a health care provider if: You keep feeling nauseous or you keep vomiting. You feel light-headed. You are still sleepy or having trouble with balance after 24 hours. You develop a rash. You have a fever. You have redness or swelling around the IV site. Get help right away if: You have trouble breathing. You have new-onset confusion at home. Summary For several hours after your procedure, you may feel tired. You may also be forgetful and have poor judgment. Have a responsible adult stay with you for the time you are told. It is important to have someone help care for you until you are awake and alert. Rest as told. Do not drive or operate machinery. Do not drink alcohol or take sleeping pills. Get help right away if you have trouble breathing, or if you suddenly become confused. This information is not intended to replace advice given to you by your health care provider. Make sure you discuss any questions you  have with your healthcare provider. Document Revised: 06/25/2020 Document Reviewed: 09/12/2019 Elsevier Patient Education  2022 [employer].

## 2021-05-29 LAB — CBC WITH DIFFERENTIAL/PLATELET
Absolute Monocytes: 697 cells/uL (ref 200–950)
Basophils Absolute: 41 cells/uL (ref 0–200)
Basophils Relative: 0.5 %
Eosinophils Absolute: 172 cells/uL (ref 15–500)
Eosinophils Relative: 2.1 %
HCT: 50 % (ref 38.5–50.0)
Hemoglobin: 16.8 g/dL (ref 13.2–17.1)
Lymphs Abs: 2690 cells/uL (ref 850–3900)
MCH: 29.8 pg (ref 27.0–33.0)
MCHC: 33.6 g/dL (ref 32.0–36.0)
MCV: 88.7 fL (ref 80.0–100.0)
MPV: 11.1 fL (ref 7.5–12.5)
Monocytes Relative: 8.5 %
Neutro Abs: 4600 cells/uL (ref 1500–7800)
Neutrophils Relative %: 56.1 %
Platelets: 233 10*3/uL (ref 140–400)
RBC: 5.64 10*6/uL (ref 4.20–5.80)
RDW: 12.9 % (ref 11.0–15.0)
Total Lymphocyte: 32.8 %
WBC: 8.2 10*3/uL (ref 3.8–10.8)

## 2021-05-29 LAB — COMPREHENSIVE METABOLIC PANEL
AG Ratio: 1.9 (calc) (ref 1.0–2.5)
ALT: 15 U/L (ref 9–46)
AST: 14 U/L (ref 10–40)
Albumin: 4.7 g/dL (ref 3.6–5.1)
Alkaline phosphatase (APISO): 54 U/L (ref 36–130)
BUN: 13 mg/dL (ref 7–25)
CO2: 24 mmol/L (ref 20–32)
Calcium: 9.3 mg/dL (ref 8.6–10.3)
Chloride: 106 mmol/L (ref 98–110)
Creat: 1.02 mg/dL (ref 0.60–1.26)
Globulin: 2.5 g/dL (calc) (ref 1.9–3.7)
Glucose, Bld: 99 mg/dL (ref 65–139)
Potassium: 4.6 mmol/L (ref 3.5–5.3)
Sodium: 144 mmol/L (ref 135–146)
Total Bilirubin: 1 mg/dL (ref 0.2–1.2)
Total Protein: 7.2 g/dL (ref 6.1–8.1)

## 2021-05-31 ENCOUNTER — Encounter (HOSPITAL_COMMUNITY)
Admission: RE | Admit: 2021-05-31 | Discharge: 2021-05-31 | Disposition: A | Discharge status: Home / Self Care | Payer: No Typology Code available for payment source | Source: Ambulatory Visit | Attending: Internal Medicine | Admitting: Internal Medicine

## 2021-05-31 ENCOUNTER — Other Ambulatory Visit: Payer: Self-pay

## 2021-05-31 ENCOUNTER — Encounter (HOSPITAL_COMMUNITY): Payer: Self-pay

## 2021-06-02 ENCOUNTER — Encounter (HOSPITAL_COMMUNITY): Payer: Self-pay | Admitting: Internal Medicine

## 2021-06-02 ENCOUNTER — Ambulatory Visit (HOSPITAL_COMMUNITY): Payer: No Typology Code available for payment source | Admitting: Anesthesiology

## 2021-06-02 ENCOUNTER — Ambulatory Visit (HOSPITAL_COMMUNITY)
Admission: RE | Admit: 2021-06-02 | Discharge: 2021-06-02 | Disposition: A | Discharge status: Home / Self Care | Payer: No Typology Code available for payment source | Attending: Internal Medicine | Admitting: Internal Medicine

## 2021-06-02 ENCOUNTER — Other Ambulatory Visit: Payer: Self-pay

## 2021-06-02 ENCOUNTER — Encounter (HOSPITAL_COMMUNITY)
Admission: RE | Disposition: A | Discharge status: Home / Self Care | Payer: Self-pay | Source: Home / Self Care | Attending: Internal Medicine

## 2021-06-02 DIAGNOSIS — R55 Syncope and collapse: Secondary | ICD-10-CM

## 2021-06-02 DIAGNOSIS — Z9049 Acquired absence of other specified parts of digestive tract: Secondary | ICD-10-CM | POA: Insufficient documentation

## 2021-06-02 DIAGNOSIS — K3189 Other diseases of stomach and duodenum: Secondary | ICD-10-CM

## 2021-06-02 DIAGNOSIS — K634 Enteroptosis: Secondary | ICD-10-CM

## 2021-06-02 DIAGNOSIS — R195 Other fecal abnormalities: Secondary | ICD-10-CM | POA: Diagnosis present

## 2021-06-02 DIAGNOSIS — K644 Residual hemorrhoidal skin tags: Secondary | ICD-10-CM | POA: Insufficient documentation

## 2021-06-02 DIAGNOSIS — Z6831 Body mass index (BMI) 31.0-31.9, adult: Secondary | ICD-10-CM | POA: Diagnosis not present

## 2021-06-02 DIAGNOSIS — K5730 Diverticulosis of large intestine without perforation or abscess without bleeding: Secondary | ICD-10-CM | POA: Insufficient documentation

## 2021-06-02 DIAGNOSIS — E669 Obesity, unspecified: Secondary | ICD-10-CM | POA: Insufficient documentation

## 2021-06-02 DIAGNOSIS — Z8379 Family history of other diseases of the digestive system: Secondary | ICD-10-CM | POA: Diagnosis not present

## 2021-06-02 DIAGNOSIS — R112 Nausea with vomiting, unspecified: Secondary | ICD-10-CM

## 2021-06-02 DIAGNOSIS — Q458 Other specified congenital malformations of digestive system: Secondary | ICD-10-CM

## 2021-06-02 DIAGNOSIS — R634 Abnormal weight loss: Secondary | ICD-10-CM | POA: Insufficient documentation

## 2021-06-02 HISTORY — PX: ESOPHAGOGASTRODUODENOSCOPY (EGD) WITH PROPOFOL: SHX5813

## 2021-06-02 HISTORY — PX: BIOPSY: SHX5522

## 2021-06-02 HISTORY — PX: COLONOSCOPY WITH PROPOFOL: SHX5780

## 2021-06-02 SURGERY — COLONOSCOPY WITH PROPOFOL
Anesthesia: General

## 2021-06-02 MED ORDER — PROPOFOL 500 MG/50ML IV EMUL
INTRAVENOUS | Status: DC | PRN
  Administered 2021-06-02: 150 ug/kg/min via INTRAVENOUS

## 2021-06-02 MED ORDER — LIDOCAINE HCL (CARDIAC) PF 100 MG/5ML IV SOSY
PREFILLED_SYRINGE | INTRAVENOUS | Status: DC | PRN
  Administered 2021-06-02: 50 mg via INTRAVENOUS

## 2021-06-02 MED ORDER — PHENYLEPHRINE 40 MCG/ML (10ML) SYRINGE FOR IV PUSH (FOR BLOOD PRESSURE SUPPORT)
PREFILLED_SYRINGE | INTRAVENOUS | Status: AC
  Filled 2021-06-02: qty 10

## 2021-06-02 MED ORDER — PHENYLEPHRINE HCL (PRESSORS) 10 MG/ML IV SOLN
INTRAVENOUS | Status: DC | PRN
  Administered 2021-06-02 (×2): 120 ug via INTRAVENOUS
  Administered 2021-06-02: 80 ug via INTRAVENOUS

## 2021-06-02 MED ORDER — LACTATED RINGERS IV SOLN: INTRAVENOUS | Status: DC

## 2021-06-02 MED ORDER — PROPOFOL 10 MG/ML IV BOLUS
INTRAVENOUS | Status: DC | PRN
  Administered 2021-06-02: 50 mg via INTRAVENOUS
  Administered 2021-06-02: 100 mg via INTRAVENOUS
  Administered 2021-06-02 (×2): 50 mg via INTRAVENOUS

## 2021-06-02 MED ORDER — STERILE WATER FOR IRRIGATION IR SOLN
Status: DC | PRN
  Administered 2021-06-02: 100 mL

## 2021-06-02 NOTE — Anesthesia Preprocedure Evaluation (Addendum)
Anesthesia Evaluation  Patient identified by MRN, date of birth, ID band Patient awake    Reviewed: Allergy & Precautions, NPO status , Patient's Chart, lab work & pertinent test results  Airway Mallampati: II  TM Distance: >3 FB Neck ROM: Full    Dental  (+) Teeth Intact, Dental Advisory Given,    Pulmonary neg pulmonary ROS,    Pulmonary exam normal        Cardiovascular negative cardio ROS   Rhythm:Regular Rate:Normal     Neuro/Psych negative neurological ROS  negative psych ROS   GI/Hepatic Neg liver ROS, Gallstones with chronic cholecystitis    Endo/Other  negative endocrine ROS  Renal/GU negative Renal ROS  negative genitourinary   Musculoskeletal negative musculoskeletal ROS (+)   Abdominal (+)  Abdomen: soft. Bowel sounds: normal.  Peds  Hematology negative hematology ROS (+)   Anesthesia Other Findings Heme positive stool  Reproductive/Obstetrics                            Anesthesia Physical  Anesthesia Plan  ASA: 2  Anesthesia Plan: General   Post-op Pain Management:    Induction: Intravenous  PONV Risk Score and Plan: TIVA  Airway Management Planned: Nasal Cannula  Additional Equipment: None  Intra-op Plan:   Post-operative Plan: Extubation in OR  Informed Consent: I have reviewed the patients History and Physical, chart, labs and discussed the procedure including the risks, benefits and alternatives for the proposed anesthesia with the patient or authorized representative who has indicated his/her understanding and acceptance.     Dental advisory given  Plan Discussed with: CRNA  Anesthesia Plan Comments: (     )        Anesthesia Quick Evaluation

## 2021-06-02 NOTE — Interval H&P Note (Signed)
History and Physical Interval Note: Patient states since he has been using hyoscyamine sublingual he feels better.  He is able to eat and when he has these spells they do not last as long.  He has noted blood on the tissue in the past but not recently. His CBC and comprehensive chemistry panel was normal. His examination examination is within normal limits. Patient is agreeable to proceed with diagnostic EGD and colonoscopy.   06/02/2021 12:51 PM  James Williams  has presented today for surgery, with the diagnosis of Nausea Vomiting Heme pos stool Vasovagal Episode.  The various methods of treatment have been discussed with the patient and family. After consideration of risks, benefits and other options for treatment, the patient has consented to  Procedure(s) with comments: COLONOSCOPY WITH PROPOFOL (N/A) - 1:30 ESOPHAGOGASTRODUODENOSCOPY (EGD) WITH PROPOFOL (N/A) as a surgical intervention.  The patient's history has been reviewed, patient examined, no change in status, stable for surgery.  I have reviewed the patient's chart and labs.  Questions were answered to the patient's satisfaction.     [employer]

## 2021-06-02 NOTE — Op Note (Signed)
[facility] Patient Name: James Williams Procedure Date: 06/02/2021 1:10 PM MRN: [id number] Date of Birth: 1986/01/21 Attending MD: Lionel December , MD CSN: [id number] Age: 35 Admit Type: Outpatient Procedure:                Colonoscopy Indications:              Heme positive stool Providers:                Lionel December, MD, Edrick Kins, RN, Edythe Clarity,                            Technician Referring MD:             Ms. Lorie Phenix, PA-C Medicines:                Propofol per Anesthesia Complications:            No immediate complications. Estimated Blood Loss:     Estimated blood loss: none. Procedure:                Pre-Anesthesia Assessment:                           - Prior to the procedure, a History and Physical                            was performed, and patient medications and                            allergies were reviewed. The patient's tolerance of                            previous anesthesia was also reviewed. The risks                            and benefits of the procedure and the sedation                            options and risks were discussed with the patient.                            All questions were answered, and informed consent                            was obtained. Prior Anticoagulants: The patient has                            taken no previous anticoagulant or antiplatelet                            agents. ASA Grade Assessment: II - A patient with                            mild systemic disease. After reviewing the risks  and benefits, the patient was deemed in                            satisfactory condition to undergo the procedure.                           After obtaining informed consent, the colonoscope                            was passed under direct vision. Throughout the                            procedure, the patient's blood pressure, pulse, and                            oxygen saturations were  monitored continuously. The                            [id number]-[id number] ([id number]) scope was introduced through                            the anus and advanced to the the terminal ileum,                            with identification of the appendiceal orifice and                            IC valve. The colonoscopy was performed without                            difficulty. The patient tolerated the procedure                            well. The quality of the bowel preparation was                            adequate. The terminal ileum, ileocecal valve,                            appendiceal orifice, and rectum were photographed. Scope In: 1:13:01 PM Scope Out: 1:25:27 PM Scope Withdrawal Time: 0 hours 8 minutes 4 seconds  Total Procedure Duration: 0 hours 12 minutes 26 seconds  Findings:      The perianal and digital rectal examinations were normal.      The terminal ileum appeared normal.      A few small-mouthed diverticula were found in the sigmoid colon.      The exam was otherwise normal throughout the examined colon.      External hemorrhoids were found during retroflexion. The hemorrhoids       were small. Impression:               - The examined portion of the ileum was normal.                           - Diverticulosis in  the sigmoid colon.                           - External hemorrhoids.                           - No specimens collected. Moderate Sedation:      Per Anesthesia Care Recommendation:           - Patient has a contact number available for                            emergencies. The signs and symptoms of potential                            delayed complications were discussed with the                            patient. Return to normal activities tomorrow.                            Written discharge instructions were provided to the                            patient.                           - High fiber diet today.                           - Continue  present medications.                           - Repeat colonoscopy at age 69 for screening                            purposes. Procedure Code(s):        --- Professional ---                           [phone], Colonoscopy, flexible; diagnostic, including                            collection of specimen(s) by brushing or washing,                            when performed (separate procedure) Diagnosis Code(s):        --- Professional ---                           K64.4, Residual hemorrhoidal skin tags                           R19.5, Other fecal abnormalities                           K57.30, Diverticulosis of large intestine without  perforation or abscess without bleeding CPT copyright 2019 American [employer]. All rights reserved. The codes documented in this report are preliminary and upon coder review may  be revised to meet current compliance requirements. Lionel DecemberNajeeb Hershall Benkert, MD Lionel DecemberNajeeb Eyden Dobie, MD 06/02/2021 1:39:39 PM This report has been signed electronically. Number of Addenda: 0

## 2021-06-02 NOTE — Op Note (Signed)
[facility] Patient Name: James Williams Procedure Date: 06/02/2021 12:53 PM MRN: [id number] Date of Birth: 1986-03-15 Attending MD: Lionel December , MD CSN: [id number] Age: 35 Admit Type: Outpatient Procedure:                Upper GI endoscopy Indications:              Nausea with vomiting, Weight loss Providers:                Lionel December, MD, Edrick Kins, RN, Edythe Clarity,                            Technician Referring MD:             Ms. Lorie Phenix, PA-C Medicines:                Propofol per Anesthesia Complications:            No immediate complications. Estimated Blood Loss:     Estimated blood loss was minimal. Procedure:                Pre-Anesthesia Assessment:                           - Prior to the procedure, a History and Physical                            was performed, and patient medications and                            allergies were reviewed. The patient's tolerance of                            previous anesthesia was also reviewed. The risks                            and benefits of the procedure and the sedation                            options and risks were discussed with the patient.                            All questions were answered, and informed consent                            was obtained. Prior Anticoagulants: The patient has                            taken no previous anticoagulant or antiplatelet                            agents. ASA Grade Assessment: II - A patient with                            mild systemic disease. After reviewing the risks  and benefits, the patient was deemed in                            satisfactory condition to undergo the procedure.                           After obtaining informed consent, the endoscope was                            passed under direct vision. Throughout the                            procedure, the patient's blood pressure, pulse, and                             oxygen saturations were monitored continuously. The                            [id number]-[id number] ([id number] scope was introduced through the                            mouth, and advanced to the second part of duodenum.                            The upper GI endoscopy was accomplished without                            difficulty. The patient tolerated the procedure                            well. Scope In: 12:57:43 PM Scope Out: 1:09:38 PM Total Procedure Duration: 0 hours 11 minutes 55 seconds  Findings:      The examined esophagus was normal.      The Z-line was regular and was found 42 cm from the incisors.      Diffuse mild mucosal changes characterized by small telangiectasia were       found in the gastric body. Biopsies were taken with a cold forceps for       histology. Specimen placed in bottle #3.      Patchy mildly erythematous mucosa without bleeding was found in the       gastric antrum. Biopsies were taken with a cold forceps for histology.       The pathology specimen was placed into Bottle Number 2.      The cardia, gastric fundus and pylorus were normal.      The duodenal bulb and second portion of the duodenum were normal.       Biopsies for histology were taken with a cold forceps for evaluation of       celiac disease. The pathology specimen was placed into Bottle Number 1. Impression:               - Normal esophagus.                           - Z-line regular, 42 cm from the incisors.                           -  Multiple tiny telangiectasia at gastric body                            without stigmata of bleed.. Biopsied.                           - Erythematous mucosa in the antrum. Biopsied.                           - Normal cardia, gastric fundus and pylorus.                           - Normal duodenal bulb and second portion of the                            duodenum. Biopsied. Moderate Sedation:      Per Anesthesia Care Recommendation:           - Patient has a  contact number available for                            emergencies. The signs and symptoms of potential                            delayed complications were discussed with the                            patient. Return to normal activities tomorrow.                            Written discharge instructions were provided to the                            patient.                           - Resume previous diet today.                           - Continue present medications.                           - No aspirin, ibuprofen, naproxen, or other                            non-steroidal anti-inflammatory drugs for 1 day.                           - Await pathology results. 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 All rights reserved. The codes documented in this report are preliminary and upon coder review may  be revised to meet current compliance requirements. Lionel December, MD Lionel December, MD 06/02/2021 1:35:24 PM This report has been signed electronically. Number of Addenda: 0

## 2021-06-02 NOTE — Discharge Instructions (Signed)
No aspirin or NSAIDs for 24 hours Resume usual medications and diet as before. No driving for 24 hours. Physician will call with biopsy results. 

## 2021-06-02 NOTE — Transfer of Care (Signed)
Immediate Anesthesia Transfer of Care Note  Patient: James Williams  Procedure(s) Performed: COLONOSCOPY WITH PROPOFOL ESOPHAGOGASTRODUODENOSCOPY (EGD) WITH PROPOFOL BIOPSY  Patient Location: Short Stay  Anesthesia Type:General  Level of Consciousness: awake and alert   Airway & Oxygen Therapy: Patient Spontanous Breathing  Post-op Assessment: Report given to RN and Post -op Vital signs reviewed and stable  Post vital signs: Reviewed and stable  Last Vitals:  Vitals Value Taken Time  BP    Temp    Pulse 86 06/02/21  1332  Resp 14 06/02/21  1332  SpO2 98 06/02/21  1332    Last Pain:  Vitals:   06/02/21 1253  TempSrc:   PainSc: 0-No pain      Patients Stated Pain Goal: 5 (06/02/21 1226)  Complications: No notable events documented.

## 2021-06-02 NOTE — Anesthesia Postprocedure Evaluation (Signed)
Anesthesia Post Note  Patient: James Williams  Procedure(s) Performed: COLONOSCOPY WITH PROPOFOL ESOPHAGOGASTRODUODENOSCOPY (EGD) WITH PROPOFOL BIOPSY  Patient location during evaluation: PACU Anesthesia Type: General Level of consciousness: awake and alert Pain management: pain level controlled Vital Signs Assessment: post-procedure vital signs reviewed and stable Respiratory status: spontaneous breathing, nonlabored ventilation, respiratory function stable and patient connected to nasal cannula oxygen Cardiovascular status: blood pressure returned to baseline and stable Postop Assessment: no apparent nausea or vomiting Anesthetic complications: no   No notable events documented.   Last Vitals:  Vitals:   06/02/21 1330 06/02/21 1332  BP:  (!) 93/54  Pulse: 79   Resp: 16   Temp: (!) 36.1 C   SpO2: 98%     Last Pain:  Vitals:   06/02/21 1330  TempSrc: Axillary  PainSc: 0-No pain                 Shona Needles

## 2021-06-04 LAB — SURGICAL PATHOLOGY

## 2021-06-09 ENCOUNTER — Other Ambulatory Visit (INDEPENDENT_AMBULATORY_CARE_PROVIDER_SITE_OTHER): Payer: Self-pay

## 2021-06-09 ENCOUNTER — Encounter (HOSPITAL_COMMUNITY): Payer: Self-pay | Admitting: Internal Medicine

## 2021-06-09 DIAGNOSIS — R112 Nausea with vomiting, unspecified: Secondary | ICD-10-CM

## 2021-06-16 ENCOUNTER — Ambulatory Visit (HOSPITAL_COMMUNITY): Payer: No Typology Code available for payment source

## 2021-06-18 ENCOUNTER — Ambulatory Visit (HOSPITAL_COMMUNITY)
Admission: RE | Admit: 2021-06-18 | Discharge: 2021-06-18 | Disposition: A | Discharge status: Home / Self Care | Payer: No Typology Code available for payment source | Source: Ambulatory Visit | Attending: Internal Medicine | Admitting: Internal Medicine

## 2021-06-18 ENCOUNTER — Encounter (HOSPITAL_COMMUNITY): Payer: Self-pay

## 2021-06-18 ENCOUNTER — Other Ambulatory Visit: Payer: Self-pay

## 2021-06-18 DIAGNOSIS — R112 Nausea with vomiting, unspecified: Secondary | ICD-10-CM | POA: Diagnosis present

## 2021-06-18 MED ORDER — TECHNETIUM TC 99M SULFUR COLLOID
2.0000 | Freq: Once | INTRAVENOUS | Status: AC | PRN
  Administered 2021-06-18: 2.2 via ORAL

## 2021-06-22 ENCOUNTER — Other Ambulatory Visit (INDEPENDENT_AMBULATORY_CARE_PROVIDER_SITE_OTHER): Payer: Self-pay | Admitting: Internal Medicine

## 2021-06-22 MED ORDER — ESOMEPRAZOLE MAGNESIUM 40 MG PO CPDR
40.0000 mg | DELAYED_RELEASE_CAPSULE | Freq: Every day | ORAL | 2 refills | Status: DC
  Filled 2021-06-22: qty 30, 30d supply, fill #0
  Filled 2021-11-02: qty 60, 60d supply, fill #0
  Filled 2021-11-17: qty 30, 30d supply, fill #0

## 2021-06-22 MED ORDER — SUCRALFATE 1 G PO TABS
2.0000 g | ORAL_TABLET | Freq: Every day | ORAL | 2 refills | Status: DC
  Filled 2021-06-22: qty 60, 30d supply, fill #0

## 2021-06-22 NOTE — Progress Notes (Signed)
eso

## 2021-06-23 ENCOUNTER — Other Ambulatory Visit (HOSPITAL_COMMUNITY): Payer: Self-pay

## 2021-06-24 ENCOUNTER — Other Ambulatory Visit (HOSPITAL_COMMUNITY): Payer: Self-pay

## 2021-06-29 ENCOUNTER — Other Ambulatory Visit (INDEPENDENT_AMBULATORY_CARE_PROVIDER_SITE_OTHER): Payer: Self-pay

## 2021-06-29 ENCOUNTER — Ambulatory Visit (INDEPENDENT_AMBULATORY_CARE_PROVIDER_SITE_OTHER): Payer: No Typology Code available for payment source | Admitting: Internal Medicine

## 2021-06-29 DIAGNOSIS — R109 Unspecified abdominal pain: Secondary | ICD-10-CM

## 2021-06-29 DIAGNOSIS — R112 Nausea with vomiting, unspecified: Secondary | ICD-10-CM

## 2021-06-29 DIAGNOSIS — R634 Abnormal weight loss: Secondary | ICD-10-CM

## 2021-07-02 ENCOUNTER — Other Ambulatory Visit (HOSPITAL_COMMUNITY): Payer: Self-pay

## 2021-07-03 ENCOUNTER — Other Ambulatory Visit (HOSPITAL_COMMUNITY): Payer: Self-pay

## 2021-07-03 MED ORDER — SERTRALINE HCL 50 MG PO TABS
50.0000 mg | ORAL_TABLET | Freq: Every day | ORAL | 0 refills | Status: DC
  Filled 2021-07-03: qty 30, 30d supply, fill #0

## 2021-07-05 ENCOUNTER — Ambulatory Visit (HOSPITAL_BASED_OUTPATIENT_CLINIC_OR_DEPARTMENT_OTHER): Admission: RE | Admit: 2021-07-05 | Payer: No Typology Code available for payment source | Source: Ambulatory Visit

## 2021-07-15 ENCOUNTER — Ambulatory Visit (INDEPENDENT_AMBULATORY_CARE_PROVIDER_SITE_OTHER): Payer: No Typology Code available for payment source | Admitting: Gastroenterology

## 2021-07-16 ENCOUNTER — Other Ambulatory Visit: Payer: Self-pay

## 2021-07-16 ENCOUNTER — Ambulatory Visit (HOSPITAL_BASED_OUTPATIENT_CLINIC_OR_DEPARTMENT_OTHER)
Admission: RE | Admit: 2021-07-16 | Discharge: 2021-07-16 | Disposition: A | Discharge status: Home / Self Care | Payer: No Typology Code available for payment source | Source: Ambulatory Visit | Attending: Internal Medicine | Admitting: Internal Medicine

## 2021-07-16 ENCOUNTER — Encounter (HOSPITAL_BASED_OUTPATIENT_CLINIC_OR_DEPARTMENT_OTHER): Payer: Self-pay

## 2021-07-16 ENCOUNTER — Other Ambulatory Visit (INDEPENDENT_AMBULATORY_CARE_PROVIDER_SITE_OTHER): Payer: Self-pay

## 2021-07-16 DIAGNOSIS — R112 Nausea with vomiting, unspecified: Secondary | ICD-10-CM | POA: Diagnosis present

## 2021-07-16 DIAGNOSIS — R634 Abnormal weight loss: Secondary | ICD-10-CM | POA: Diagnosis present

## 2021-07-16 MED ORDER — IOHEXOL 350 MG/ML SOLN
75.0000 mL | Freq: Once | INTRAVENOUS | Status: AC | PRN
  Administered 2021-07-16: 75 mL via INTRAVENOUS

## 2021-07-27 ENCOUNTER — Encounter (INDEPENDENT_AMBULATORY_CARE_PROVIDER_SITE_OTHER): Payer: Self-pay

## 2021-07-27 NOTE — Telephone Encounter (Signed)
I called pt's wife and she states he has had 4 test that are all normal. Symptoms always in the mornings usually between 6-8:30 am. This morning his symptoms are more intense and lasted til 10:30 or 11. Wife states she makes sure he takes his anxiety med every mnorning before she leaves for work but states he is noncompliant with taking all his other meds. States he probably has only taken his reflux meds 3 or 4 times since being prescribed. States zofran helps some but he does not take it often. He did take one today. She thought that you might would want to see him while he was having symptoms today but by time I called her back he was better.

## 2021-07-28 NOTE — Telephone Encounter (Signed)
Discussed with dr Karilyn Cota and he wants to see pt on 10/11 at 8 am. Called and spoke with pt's wife and she was notified.

## 2021-08-03 ENCOUNTER — Other Ambulatory Visit: Payer: Self-pay

## 2021-08-03 ENCOUNTER — Other Ambulatory Visit (HOSPITAL_BASED_OUTPATIENT_CLINIC_OR_DEPARTMENT_OTHER): Payer: Self-pay

## 2021-08-03 ENCOUNTER — Ambulatory Visit (INDEPENDENT_AMBULATORY_CARE_PROVIDER_SITE_OTHER): Payer: No Typology Code available for payment source | Admitting: Internal Medicine

## 2021-08-03 ENCOUNTER — Encounter (INDEPENDENT_AMBULATORY_CARE_PROVIDER_SITE_OTHER): Payer: Self-pay | Admitting: Internal Medicine

## 2021-08-03 VITALS — BP 108/71 | HR 64 | Temp 98.3°F | Ht 74.0 in | Wt 244.2 lb

## 2021-08-03 DIAGNOSIS — R42 Dizziness and giddiness: Secondary | ICD-10-CM | POA: Insufficient documentation

## 2021-08-03 DIAGNOSIS — H814 Vertigo of central origin: Secondary | ICD-10-CM

## 2021-08-03 DIAGNOSIS — R112 Nausea with vomiting, unspecified: Secondary | ICD-10-CM

## 2021-08-03 DIAGNOSIS — R634 Abnormal weight loss: Secondary | ICD-10-CM | POA: Diagnosis not present

## 2021-08-03 MED ORDER — MECLIZINE HCL 25 MG PO TABS
25.0000 mg | ORAL_TABLET | Freq: Every day | ORAL | 0 refills | Status: DC
  Filled 2021-08-03: qty 30, 30d supply, fill #0

## 2021-08-03 MED ORDER — OMRON 3 SERIES BP MONITOR DEVI
0 refills | Status: DC
  Filled 2021-08-03: qty 1, 30d supply, fill #0

## 2021-08-03 NOTE — Progress Notes (Signed)
Presenting complaint;  Follow-up for nausea vomiting weight loss and upper abdominal pain.  Database and subjective:  Patient is 35 year old Caucasian male who is here for scheduled visit accompanied by his wife Lance Bosch. He was initially seen on 05/25/2021 for 4-year history of symptom complex consisting of abdominal gurgling after voiding associated with nausea vomiting upper abdominal pain as well as weakness and dizziness.  Early on the symptoms are intermittent but prior to his last visit they have been occurring almost every day.  He also reported having lost 40 pounds.  He says his baseline weight has been around 285 pounds.  On his last visit he weighed 241 pounds.  Today he was 244 pounds. He was found to have cholelithiasis which was felt to be source of his symptoms.  He underwent laparoscopic cholecystectomy in March this year.  However he did not experience relief of his symptoms. On his last visit he was noted to have heme positive stool. CBC and comprehensive chemistry panel were normal.  Is advised to try sublingual hyoscyamine as soon as he would wake up in the morning.  He was also given prescription for ondansetron.  He underwent esophagogastroduodenoscopy and colonoscopy on 06/02/2021. EGD revealed few gastric telangiectasia and antral erythema.  Gastric biopsy was negative for H. pylori infection for eosinophilic gastritis.  Duodenal biopsy did not show any changes of celiac disease. Colonoscopy revealed few diverticuli at sigmoid colon and external hemorrhoids. Patient did not respond to sublingual hyoscyamine as well as nighttime Nexium. He wondered if his symptoms are manifestations of anxiety.  Since then he has been followed with Dr. Garner Nash office and was begun on Zoloft 1 month ago.  His  In the meantime he had solid-phase gastric emptying study on 06/20/2019 was within normal limits. He also had abdominal pelvic CT on 07/16/2021 which revealed no abnormality to account for  symptoms. He reports no improvement with Zoloft.  His symptoms have not changed.  He also complains of lightheadedness as well as vertigo particularly when he lies on his right side.  He remains with gurgling and pain across his upper abdomen.  No vomiting generally consists of fluid and bile but no blood.  His bowels generally move regularly.  On most days he may have 4-5 formed stools.  He may have diarrhea once in a while.  He has not experienced melena or rectal bleeding.  He has not lost any weight since his last visit. He wonders if his pressure drops when he has the symptoms.  He also indicates to me that his symptoms improve when he is intermittent with his wife in the morning with symptoms so recur. These episodes are not associated with skin rash wheezing or diaphoresis. He sleeps well.  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  He drinks alcohol occasionally.  He does not drink carbonated drinks alcohol.   Current Medications: Outpatient Encounter Medications as of 08/03/2021  Medication Sig   ondansetron (ZOFRAN) 4 MG tablet Take 1 tablet  by mouth 2  times daily as needed for nausea or vomiting.   sertraline (ZOLOFT) 50 MG tablet Take 1 tablet (50 mg total) by mouth daily.   AUVI-Q 0.3 MG/0.3ML SOAJ injection Use as directed for life-threatening allergic reaction. (Patient not taking: Reported on 08/03/2021)   esomeprazole (NEXIUM) 40 MG capsule Take 1 capsule (40 mg total) by mouth daily before supper. (Patient not taking: Reported on 08/03/2021)   hyoscyamine (LEVSIN SL) 0.125 MG SL tablet Place 1 tablet  under the tongue every morning. (Patient not taking: Reported on 08/03/2021)   sucralfate (CARAFATE) 1 g tablet Take 2 tablets (2 g total) by mouth at bedtime. (Patient not taking: Reported on 08/03/2021)   No facility-administered encounter medications on file as of 08/03/2021.    Objective: Blood pressure 108/71, pulse 64, temperature 98.3 F (36.8 C),  temperature source Oral, height 6\' 2"  (1.88 m), weight 244 lb 3.2 oz (110.8 kg). Patient is alert and in no acute distress. Conjunctiva is pink. Sclera is nonicteric Oropharyngeal mucosa is normal. No neck masses or thyromegaly noted. Cardiac exam with regular rhythm normal S1 and S2. No murmur or gallop noted. Lungs are clear to auscultation. Abdomen is symmetrical.  Bowel sounds are normal.  On palpation abdomen is soft and nontender without organomegaly or masses. Does not have inguinal hernia. No LE edema or clubbing noted.  Labs/studies Results:   CBC Latest Ref Rng & Units 05/28/2021 12/30/2020  WBC 3.8 - 10.8 Thousand/uL 8.2 7.7  Hemoglobin 13.2 - 17.1 g/dL 03/01/2021 62.9  Hematocrit 47.6 - 50.0 % 50.0 50.0  Platelets 140 - 400 Thousand/uL 233 219    CMP Latest Ref Rng & Units 05/28/2021 12/30/2020  Glucose 65 - 139 mg/dL 99 03/01/2021)  BUN 7 - 25 mg/dL 13 14  Creatinine 503(T - 1.26 mg/dL 4.65 6.81  Sodium 2.75 - 146 mmol/L 144 140  Potassium 3.5 - 5.3 mmol/L 4.6 3.9  Chloride 98 - 110 mmol/L 106 106  CO2 20 - 32 mmol/L 24 26  Calcium 8.6 - 10.3 mg/dL 9.3 9.3  Total Protein 6.1 - 8.1 g/dL 7.2 -  Total Bilirubin 0.2 - 1.2 mg/dL 1.0 -  AST 10 - 40 U/L 14 -  ALT 9 - 46 U/L 15 -    Hepatic Function Latest Ref Rng & Units 05/28/2021  Total Protein 6.1 - 8.1 g/dL 7.2  AST 10 - 40 U/L 14  ALT 9 - 46 U/L 15  Total Bilirubin 0.2 - 1.2 mg/dL 1.0     Assessment:  #1.  Recurrent symptoms of abdominal gurgling followed by nausea vomiting as well as lightheadedness occurring almost every morning after micturition but not with defecation.  He feels better after about an hour.  Symptoms started even before he eats or drinks anything.  Routine blood work has been unremarkable.  EGD and colonoscopy did not reveal any abnormality to account for his symptoms.  Duodenal biopsy was negative for celiac disease.  Cholecystectomy for 7 months ago did not provide any relief.   He also had normal gastric emptying  and unremarkable CT few weeks ago.  He has been on low-dose Zoloft with his not feeling any better. I doubt that his symptoms are due to structural abnormality.  I wonder if the symptoms are due to autonomic dysfunction or manifestation of anxiety.  Given his weight loss will proceed with testing to rule out Addison's disease. In the meantime he may want a follow-up with Dr. 07/28/2021 and associates to see if he would benefit from higher dose of Zoloft and may be should be switched to another agent.  If we switch to another antidepressant would consider TCA.  #2.  Vertigo.  It remains to be seen if it is related to the rest of his symptom complex.  #3.  Weight loss.  While he has lost 40 pounds over a period of several months that he has not lost any weight since his last visit 9 weeks ago.   Plan:  Patient will go to the lab for fasting cortisol level, CBC and comprehensive chemistry panel. Head CT without and with contrast. Meclizine 25 mg by mouth daily at bedtime. Patient advised to keep symptom diary for rest of this month and also check his blood pressure when he wakes up in the morning. Office visit in 2 months.

## 2021-08-03 NOTE — Patient Instructions (Signed)
Keep daily symptom diary for rest of this month. Take meclizine/Antivert daily at bedtime. Can take Nexium/esomeprazole 30 minutes before breakfast daily and Cytotec in the evening. Physician will call with results of blood test and head CT. Please let Dr. Reuel Boom know that Zoloft at current dose is not working.  If he is planning to change to another medication consider tricyclic antidepressant which has anticholinergic properties and may help with GI symptoms.

## 2021-08-06 ENCOUNTER — Other Ambulatory Visit (HOSPITAL_BASED_OUTPATIENT_CLINIC_OR_DEPARTMENT_OTHER): Payer: Self-pay

## 2021-08-06 ENCOUNTER — Ambulatory Visit (HOSPITAL_BASED_OUTPATIENT_CLINIC_OR_DEPARTMENT_OTHER)
Admission: RE | Admit: 2021-08-06 | Discharge: 2021-08-06 | Disposition: A | Discharge status: Home / Self Care | Payer: No Typology Code available for payment source | Source: Ambulatory Visit | Attending: Internal Medicine | Admitting: Internal Medicine

## 2021-08-06 ENCOUNTER — Other Ambulatory Visit: Payer: Self-pay

## 2021-08-06 DIAGNOSIS — H814 Vertigo of central origin: Secondary | ICD-10-CM | POA: Insufficient documentation

## 2021-08-06 MED ORDER — IOHEXOL 300 MG/ML  SOLN
100.0000 mL | Freq: Once | INTRAMUSCULAR | Status: AC | PRN
  Administered 2021-08-06: 75 mL via INTRAVENOUS

## 2021-08-06 MED ORDER — AMITRIPTYLINE HCL 10 MG PO TABS
ORAL_TABLET | ORAL | 1 refills | Status: DC
  Filled 2021-08-06: qty 60, 30d supply, fill #0
  Filled 2021-09-06: qty 60, 30d supply, fill #1

## 2021-08-09 LAB — COMPREHENSIVE METABOLIC PANEL
AG Ratio: 2.2 (calc) (ref 1.0–2.5)
ALT: 12 U/L (ref 9–46)
AST: 12 U/L (ref 10–40)
Albumin: 4.6 g/dL (ref 3.6–5.1)
Alkaline phosphatase (APISO): 51 U/L (ref 36–130)
BUN: 14 mg/dL (ref 7–25)
CO2: 27 mmol/L (ref 20–32)
Calcium: 9.1 mg/dL (ref 8.6–10.3)
Chloride: 106 mmol/L (ref 98–110)
Creat: 0.89 mg/dL (ref 0.60–1.26)
Globulin: 2.1 g/dL (calc) (ref 1.9–3.7)
Glucose, Bld: 99 mg/dL (ref 65–99)
Potassium: 4.6 mmol/L (ref 3.5–5.3)
Sodium: 141 mmol/L (ref 135–146)
Total Bilirubin: 0.7 mg/dL (ref 0.2–1.2)
Total Protein: 6.7 g/dL (ref 6.1–8.1)

## 2021-08-09 LAB — CBC WITH DIFFERENTIAL/PLATELET
Absolute Monocytes: 758 cells/uL (ref 200–950)
Basophils Absolute: 47 cells/uL (ref 0–200)
Basophils Relative: 0.6 %
Eosinophils Absolute: 158 cells/uL (ref 15–500)
Eosinophils Relative: 2 %
HCT: 48.6 % (ref 38.5–50.0)
Hemoglobin: 16.5 g/dL (ref 13.2–17.1)
Lymphs Abs: 2346 cells/uL (ref 850–3900)
MCH: 30.1 pg (ref 27.0–33.0)
MCHC: 34 g/dL (ref 32.0–36.0)
MCV: 88.5 fL (ref 80.0–100.0)
MPV: 11.6 fL (ref 7.5–12.5)
Monocytes Relative: 9.6 %
Neutro Abs: 4590 cells/uL (ref 1500–7800)
Neutrophils Relative %: 58.1 %
Platelets: 214 10*3/uL (ref 140–400)
RBC: 5.49 10*6/uL (ref 4.20–5.80)
RDW: 12.8 % (ref 11.0–15.0)
Total Lymphocyte: 29.7 %
WBC: 7.9 10*3/uL (ref 3.8–10.8)

## 2021-08-09 LAB — CORTISOL: Cortisol, Plasma: 11.8 ug/dL

## 2021-09-03 ENCOUNTER — Other Ambulatory Visit (HOSPITAL_COMMUNITY): Payer: Self-pay

## 2021-09-03 MED ORDER — ESOMEPRAZOLE MAGNESIUM 40 MG PO CPDR
40.0000 mg | DELAYED_RELEASE_CAPSULE | Freq: Every day | ORAL | 0 refills | Status: DC
  Filled 2021-09-03 – 2021-09-06 (×2): qty 90, 90d supply, fill #0

## 2021-09-06 ENCOUNTER — Other Ambulatory Visit (HOSPITAL_BASED_OUTPATIENT_CLINIC_OR_DEPARTMENT_OTHER): Payer: Self-pay

## 2021-09-06 ENCOUNTER — Other Ambulatory Visit (INDEPENDENT_AMBULATORY_CARE_PROVIDER_SITE_OTHER): Payer: Self-pay | Admitting: Internal Medicine

## 2021-09-06 NOTE — Telephone Encounter (Signed)
Seen on 08/03/21. Has upcoming appt on 10/28/21

## 2021-09-07 ENCOUNTER — Other Ambulatory Visit (HOSPITAL_BASED_OUTPATIENT_CLINIC_OR_DEPARTMENT_OTHER): Payer: Self-pay

## 2021-09-07 MED ORDER — MECLIZINE HCL 25 MG PO TABS
25.0000 mg | ORAL_TABLET | Freq: Every day | ORAL | 2 refills | Status: DC
  Filled 2021-09-07: qty 30, 30d supply, fill #0
  Filled 2021-11-02: qty 60, 60d supply, fill #1

## 2021-09-08 ENCOUNTER — Other Ambulatory Visit (HOSPITAL_COMMUNITY): Payer: Self-pay

## 2021-10-28 ENCOUNTER — Ambulatory Visit (INDEPENDENT_AMBULATORY_CARE_PROVIDER_SITE_OTHER): Payer: No Typology Code available for payment source | Admitting: Internal Medicine

## 2021-11-02 ENCOUNTER — Telehealth (INDEPENDENT_AMBULATORY_CARE_PROVIDER_SITE_OTHER): Payer: No Typology Code available for payment source | Admitting: Gastroenterology

## 2021-11-02 ENCOUNTER — Other Ambulatory Visit (HOSPITAL_BASED_OUTPATIENT_CLINIC_OR_DEPARTMENT_OTHER): Payer: Self-pay

## 2021-11-02 MED ORDER — AMITRIPTYLINE HCL 10 MG PO TABS
ORAL_TABLET | ORAL | 1 refills | Status: DC
  Filled 2021-11-02: qty 60, 30d supply, fill #0

## 2021-11-03 ENCOUNTER — Other Ambulatory Visit (HOSPITAL_BASED_OUTPATIENT_CLINIC_OR_DEPARTMENT_OTHER): Payer: Self-pay

## 2021-11-08 ENCOUNTER — Other Ambulatory Visit (HOSPITAL_BASED_OUTPATIENT_CLINIC_OR_DEPARTMENT_OTHER): Payer: Self-pay

## 2021-11-17 ENCOUNTER — Other Ambulatory Visit (HOSPITAL_BASED_OUTPATIENT_CLINIC_OR_DEPARTMENT_OTHER): Payer: Self-pay

## 2021-11-23 ENCOUNTER — Encounter (INDEPENDENT_AMBULATORY_CARE_PROVIDER_SITE_OTHER): Payer: Self-pay | Admitting: Internal Medicine

## 2021-11-23 ENCOUNTER — Other Ambulatory Visit (HOSPITAL_BASED_OUTPATIENT_CLINIC_OR_DEPARTMENT_OTHER): Payer: Self-pay

## 2021-11-23 ENCOUNTER — Ambulatory Visit (INDEPENDENT_AMBULATORY_CARE_PROVIDER_SITE_OTHER): Payer: No Typology Code available for payment source | Admitting: Internal Medicine

## 2021-11-23 ENCOUNTER — Other Ambulatory Visit: Payer: Self-pay

## 2021-11-23 VITALS — BP 121/79 | HR 97 | Temp 99.1°F | Ht 74.0 in | Wt 271.0 lb

## 2021-11-23 DIAGNOSIS — R112 Nausea with vomiting, unspecified: Secondary | ICD-10-CM

## 2021-11-23 DIAGNOSIS — R634 Abnormal weight loss: Secondary | ICD-10-CM

## 2021-11-23 MED ORDER — MECLIZINE HCL 25 MG PO TABS
25.0000 mg | ORAL_TABLET | Freq: Every day | ORAL | 1 refills | Status: DC
  Filled 2021-11-23: qty 90, 90d supply, fill #0
  Filled 2022-01-24: qty 60, 60d supply, fill #0
  Filled 2022-03-28: qty 60, 60d supply, fill #1
  Filled 2022-06-02: qty 60, 60d supply, fill #2

## 2021-11-23 MED ORDER — AMITRIPTYLINE HCL 10 MG PO TABS
ORAL_TABLET | ORAL | 5 refills | Status: DC
  Filled 2021-11-23 – 2021-12-08 (×2): qty 60, 30d supply, fill #0
  Filled 2022-03-28: qty 60, 30d supply, fill #1
  Filled 2022-05-16: qty 60, 30d supply, fill #2
  Filled 2022-07-08: qty 60, 30d supply, fill #3
  Filled 2022-08-06: qty 60, 30d supply, fill #4
  Filled 2022-09-09: qty 60, 30d supply, fill #0

## 2021-11-23 MED ORDER — ESOMEPRAZOLE MAGNESIUM 40 MG PO CPDR
40.0000 mg | DELAYED_RELEASE_CAPSULE | Freq: Every day | ORAL | 0 refills | Status: DC
  Filled 2021-11-23: qty 90, 90d supply, fill #0

## 2021-11-23 NOTE — Patient Instructions (Signed)
Please call office with progress report in June, 2023.

## 2021-11-23 NOTE — Progress Notes (Signed)
Presenting complaint;  Follow-up for nausea vomiting abdominal pain and gurgling and weight loss.  Database and subjective:  Patient is 36 year old Caucasian male who is here for scheduled visit.  He was last seen in October 2022. He was initially seen in August 2022 for 4-year history of abdominal gurgling pain nausea and vomiting occurring after voiding every morning also associated with extreme weakness and dizziness.  He had lost 40 pounds.  He was found to have cholelithiasis and underwent cholecystectomy in March last year.  However cholecystectomy did not provide any symptom relief. Work-up after his first visit included normal CBC and chemistry panel.  He did not respond to antispasmodic.  Esophagogastroduodenoscopy and colonoscopy were unremarkable.  Duodenal biopsy did not show any changes of celiac disease.  Colonoscopy revealed sigmoid diverticulosis and external hemorrhoids. He also had normal solid-phase gastric emptying study in fasting cortisol level. Abdominal pelvic CT in March last year was unremarkable. He did not respond to Zoloft. He was begun on amitriptyline and maintained on esomeprazole. He noted improvement on his last visit.  Patient says he is doing well.  He has gained 27 pounds since his last visit of 08/03/2021.  He says as long as he takes his medications he does not have any symptoms in the morning.  However if he skips amitriptyline and esomeprazole his symptoms relapse within 2 days but not as bad.  His appetite is good.  He is having 2 formed stools daily.  Occasionally he may have 3 stools.  He denies melena or rectal bleeding.  Overall he feels much better.  He is now walking and running.  He does 6 to 8 miles every week. He is not having any side effects with amitriptyline.  Current Medications: Outpatient Encounter Medications as of 11/23/2021  Medication Sig   amitriptyline (ELAVIL) 10 MG tablet Take 2 Tablet(s) Oral every night at bedtime   Blood  Pressure Monitoring (OMRON 3 SERIES BP MONITOR) DEVI Use as directed.   esomeprazole (NEXIUM) 40 MG capsule Take 1 capsule (40 mg total) by mouth daily.   meclizine (ANTIVERT) 25 MG tablet Take 1 tablet (25 mg total) by mouth at bedtime.   [DISCONTINUED] ondansetron (ZOFRAN) 4 MG tablet Take 1 tablet  by mouth 2  times daily as needed for nausea or vomiting.   [DISCONTINUED] esomeprazole (NEXIUM) 40 MG capsule Take 1 capsule (40 mg total) by mouth daily before supper. (Patient not taking: Reported on 08/03/2021)   [DISCONTINUED] sertraline (ZOLOFT) 50 MG tablet Take 1 tablet (50 mg total) by mouth daily.   No facility-administered encounter medications on file as of 11/23/2021.     Objective: Blood pressure 121/79, pulse 97, temperature 99.1 F (37.3 C), temperature source Oral, height 6\' 2"  (1.88 m), weight 271 lb (122.9 kg). Patient is alert and in no acute distress. Conjunctiva is pink. Sclera is nonicteric Oropharyngeal mucosa is normal. No neck masses or thyromegaly noted. Cardiac exam with regular rhythm normal S1 and S2. No murmur or gallop noted. Lungs are clear to auscultation. Abdomen is full but soft and nontender with organomegaly or masses. No LE edema or clubbing noted.  Labs/studies Results:   CBC Latest Ref Rng & Units 08/06/2021 05/28/2021 12/30/2020  WBC 3.8 - 10.8 Thousand/uL 7.9 8.2 7.7  Hemoglobin 13.2 - 17.1 g/dL 16.5 16.8 16.9  Hematocrit 38.5 - 50.0 % 48.6 50.0 50.0  Platelets 140 - 400 Thousand/uL 214 233 219    CMP Latest Ref Rng & Units 08/06/2021 05/28/2021 12/30/2020  Glucose  65 - 99 mg/dL 99 99 102(H)  BUN 7 - 25 mg/dL 14 13 14   Creatinine 0.60 - 1.26 mg/dL 0.89 1.02 0.83  Sodium 135 - 146 mmol/L 141 144 140  Potassium 3.5 - 5.3 mmol/L 4.6 4.6 3.9  Chloride 98 - 110 mmol/L 106 106 106  CO2 20 - 32 mmol/L 27 24 26   Calcium 8.6 - 10.3 mg/dL 9.1 9.3 9.3  Total Protein 6.1 - 8.1 g/dL 6.7 7.2 -  Total Bilirubin 0.2 - 1.2 mg/dL 0.7 1.0 -  AST 10 - 40 U/L 12  14 -  ALT 9 - 46 U/L 12 15 -    Hepatic Function Latest Ref Rng & Units 08/06/2021 05/28/2021  Total Protein 6.1 - 8.1 g/dL 6.7 7.2  AST 10 - 40 U/L 12 14  ALT 9 - 46 U/L 12 15  Total Bilirubin 0.2 - 1.2 mg/dL 0.7 1.0      Assessment:  #1.  Nausea vomiting abdominal pain and gurglin  every morning on waking up responding to low-dose TCA and PPI.  Extensive work-up has been negative.  Symptom control is excellent with therapy.  May consider changing therapy at the time of next visit end of the year.  #2.  Vertigo.  Bedtime meclizine has helped.  #3.  Weight loss.  Work-up has been negative.  He has gained 27 pounds since his last visit and his weight now is close to his baseline.  Plan:  Patient will continue current therapy for now. New prescription given for amitriptyline 20 mg by mouth daily at bedtime 1 month supply with 5 refills. New prescription also given for esomeprazole 40 mg by mouth every morning.  90 with 0 refills. Prescription also given for meclizine 25 mg by mouth daily at bedtime 90 with 1 refill. Patient will call with progress report in June 2023 at which time may consider dropping PPI dose. He will return for office visit in December 2023.

## 2021-12-08 ENCOUNTER — Other Ambulatory Visit (HOSPITAL_BASED_OUTPATIENT_CLINIC_OR_DEPARTMENT_OTHER): Payer: Self-pay

## 2022-01-24 ENCOUNTER — Other Ambulatory Visit (HOSPITAL_BASED_OUTPATIENT_CLINIC_OR_DEPARTMENT_OTHER): Payer: Self-pay

## 2022-03-28 ENCOUNTER — Other Ambulatory Visit (HOSPITAL_BASED_OUTPATIENT_CLINIC_OR_DEPARTMENT_OTHER): Payer: Self-pay

## 2022-05-16 ENCOUNTER — Other Ambulatory Visit (HOSPITAL_BASED_OUTPATIENT_CLINIC_OR_DEPARTMENT_OTHER): Payer: Self-pay

## 2022-06-02 ENCOUNTER — Other Ambulatory Visit (HOSPITAL_COMMUNITY): Payer: Self-pay

## 2022-06-21 ENCOUNTER — Other Ambulatory Visit (HOSPITAL_COMMUNITY): Payer: Self-pay

## 2022-06-21 MED ORDER — PREDNISONE 20 MG PO TABS
ORAL_TABLET | ORAL | 0 refills | Status: DC
  Filled 2022-06-21: qty 20, 12d supply, fill #0

## 2022-07-08 ENCOUNTER — Other Ambulatory Visit (HOSPITAL_BASED_OUTPATIENT_CLINIC_OR_DEPARTMENT_OTHER): Payer: Self-pay

## 2022-07-26 ENCOUNTER — Other Ambulatory Visit (HOSPITAL_COMMUNITY): Payer: Self-pay

## 2022-07-26 ENCOUNTER — Other Ambulatory Visit (INDEPENDENT_AMBULATORY_CARE_PROVIDER_SITE_OTHER): Payer: Self-pay | Admitting: Internal Medicine

## 2022-07-26 MED ORDER — MECLIZINE HCL 25 MG PO TABS
25.0000 mg | ORAL_TABLET | Freq: Every day | ORAL | 0 refills | Status: DC
  Filled 2022-07-26: qty 90, 90d supply, fill #0

## 2022-08-06 ENCOUNTER — Other Ambulatory Visit (HOSPITAL_COMMUNITY): Payer: Self-pay

## 2022-09-09 ENCOUNTER — Other Ambulatory Visit (HOSPITAL_COMMUNITY): Payer: Self-pay

## 2022-09-19 ENCOUNTER — Encounter (INDEPENDENT_AMBULATORY_CARE_PROVIDER_SITE_OTHER): Payer: Self-pay | Admitting: Gastroenterology

## 2022-10-03 ENCOUNTER — Ambulatory Visit (INDEPENDENT_AMBULATORY_CARE_PROVIDER_SITE_OTHER): Payer: No Typology Code available for payment source | Admitting: Gastroenterology

## 2022-10-06 ENCOUNTER — Other Ambulatory Visit: Payer: Self-pay | Admitting: Gastroenterology

## 2022-10-06 ENCOUNTER — Other Ambulatory Visit (HOSPITAL_COMMUNITY): Payer: Self-pay

## 2022-10-06 ENCOUNTER — Encounter: Payer: Self-pay | Admitting: Gastroenterology

## 2022-10-06 ENCOUNTER — Other Ambulatory Visit: Payer: Self-pay

## 2022-10-06 ENCOUNTER — Ambulatory Visit (INDEPENDENT_AMBULATORY_CARE_PROVIDER_SITE_OTHER): Payer: No Typology Code available for payment source | Admitting: Gastroenterology

## 2022-10-06 VITALS — BP 146/76 | HR 64 | Temp 98.3°F | Ht 73.0 in | Wt 289.2 lb

## 2022-10-06 DIAGNOSIS — R55 Syncope and collapse: Secondary | ICD-10-CM | POA: Diagnosis not present

## 2022-10-06 DIAGNOSIS — Z131 Encounter for screening for diabetes mellitus: Secondary | ICD-10-CM

## 2022-10-06 DIAGNOSIS — R112 Nausea with vomiting, unspecified: Secondary | ICD-10-CM

## 2022-10-06 DIAGNOSIS — Z87892 Personal history of anaphylaxis: Secondary | ICD-10-CM

## 2022-10-06 MED ORDER — AMITRIPTYLINE HCL 10 MG PO TABS
20.0000 mg | ORAL_TABLET | Freq: Every evening | ORAL | 5 refills | Status: DC
  Filled 2022-10-06 – 2022-10-10 (×2): qty 60, 30d supply, fill #0
  Filled 2022-12-30: qty 60, 30d supply, fill #1

## 2022-10-06 MED ORDER — ESOMEPRAZOLE MAGNESIUM 40 MG PO CPDR
40.0000 mg | DELAYED_RELEASE_CAPSULE | Freq: Every day | ORAL | 3 refills | Status: DC
  Filled 2022-10-06: qty 90, 90d supply, fill #0

## 2022-10-06 MED FILL — Meclizine HCl Tab 25 MG: ORAL | 90 days supply | Qty: 90 | Fill #0 | Status: CN

## 2022-10-06 NOTE — Progress Notes (Addendum)
Gastroenterology Office Note     Primary Care Physician:  Richardean Chimera, MD  Primary Gastroenterologist: previously Dr. Karilyn Cota   Chief Complaint   Chief Complaint  Patient presents with   New Patient (Initial Visit)    Medication refills     History of Present Illness   James Williams is a 36 y.o. male presenting today in follow-up with a history of abdominal pain, weight loss, N/V. EGD/colonoscopy, duodenal biopsies unremarkable. GES normal, normal fasting cortisol level. Started on amitriptyline and continued on Nexium in the past. He is here today for med refills.   Previously seen by Dr. Karilyn Cota, establishing care in Aug 2022. Noting chronic spells for over 4 years usually in morning after voiding. Shortly after voiding, would note nausea, dizzy, lightheaded, occasional vomiting, weakness. He underwent evaluation as above.    Stopped taking Elavil and Nexium on Friday. He wanted to see how he felt off of it. Over past few months if goes a long time without eating will get really dizzy, hear bubbles in his esophagus. Will feel shaky. Sometimes nauseated. As soon as hears the bubbles in throat, will start feeling dizzy.Feels hot and cold with the episodes. Episodes will last from 20 minutes to an hour. Only thing that helps is standing up.     Stools are looser in morning and normal in afternoon.  No weight loss.   He would like to be evaluated for alpha gal again.     Past Medical History:  Diagnosis Date   Allergy to alpha-gal    Mammal meat allergy    Past Surgical History:  Procedure Laterality Date   BIOPSY  06/02/2021   Procedure: BIOPSY;  Surgeon: Malissa Hippo, MD;  Location: [facility];  Service: Endoscopy;;   CHOLECYSTECTOMY N/A 12/31/2020   Procedure: LAPAROSCOPIC CHOLECYSTECTOMY WITH INTRAOPERATIVE CHOLANGIOGRAM;  Surgeon: Luretha Murphy, MD;  Location: [facility];  Service: General;  Laterality: N/A;   COLONOSCOPY WITH PROPOFOL N/A 06/02/2021    Procedure: COLONOSCOPY WITH PROPOFOL;  Surgeon: Malissa Hippo, MD;  Location: [facility];  Service: Endoscopy;  Laterality: N/A;  1:30   ESOPHAGOGASTRODUODENOSCOPY (EGD) WITH PROPOFOL N/A 06/02/2021   Procedure: ESOPHAGOGASTRODUODENOSCOPY (EGD) WITH PROPOFOL;  Surgeon: Malissa Hippo, MD;  Location: [facility];  Service: Endoscopy;  Laterality: N/A;   WISDOM TOOTH EXTRACTION      Current Outpatient Medications  Medication Sig Dispense Refill   meclizine (ANTIVERT) 25 MG tablet Take 1 tablet (25 mg total) by mouth at bedtime. 90 tablet 0   amitriptyline (ELAVIL) 10 MG tablet Take 2 tablets by mouth every night at bedtime 60 tablet 5   Blood Pressure Monitoring (OMRON 3 SERIES BP MONITOR) DEVI Use as directed. 1 each 0   esomeprazole (NEXIUM) 40 MG capsule Take 1 capsule (40 mg total) by mouth daily. 90 capsule 3   predniSONE (DELTASONE) 20 MG tablet Take 3 tablets by mouth daily for 3 days, 2 tabs daily for 3 days, 1 tab daily for 3 days, 1/2 tab daily for 3 days. 20 tablet 0   No current facility-administered medications for this visit.    Allergies as of 10/06/2022   (No Known Allergies)    No family history on file.  Social History   Socioeconomic History   Marital status: Married    Spouse name: Not on file   Number of children: Not on file   Years of education: Not on file  Highest education level: Not on file  Occupational History   Not on file  Tobacco Use   Smoking status: Never   Smokeless tobacco: Never  Vaping Use   Vaping Use: Never used  Substance and Sexual Activity   Alcohol use: Yes    Comment: one beer a month   Drug use: Not Currently    Types: Marijuana   Sexual activity: Yes    Birth control/protection: None  Other Topics Concern   Not on file  Social History Narrative   ** Merged History Encounter **       Social Determinants of Health   Financial Resource Strain: Not on file  Food Insecurity: Not on file  Transportation Needs:  Not on file  Physical Activity: Not on file  Stress: Not on file  Social Connections: Not on file  Intimate Partner Violence: Not on file     Review of Systems   Gen: Denies any fever, chills, fatigue, weight loss, lack of appetite.  CV: Denies chest pain, heart palpitations, peripheral edema, syncope.  Resp: Denies shortness of breath at rest or with exertion. Denies wheezing or cough.  GI: see HPI GU : Denies urinary burning, urinary frequency, urinary hesitancy MS: Denies joint pain, muscle weakness, cramps, or limitation of movement.  Derm: Denies rash, itching, dry skin Psych: Denies depression, anxiety, memory loss, and confusion Heme: Denies bruising, bleeding, and enlarged lymph nodes.   Physical Exam   BP (!) 146/76   Pulse 64   Temp 98.3 F (36.8 C)   Ht 6\' 1"  (1.854 m)   Wt 289 lb 3.2 oz (131.2 kg)   BMI 38.16 kg/m  General:   Alert and oriented. Pleasant and cooperative. Well-nourished and well-developed.  Head:  Normocephalic and atraumatic. Eyes:  Without icterus Abdomen:  +BS, soft, non-tender and non-distended. No HSM noted. No guarding or rebound. No masses appreciated.  Rectal:  Deferred  Msk:  Symmetrical without gross deformities. Normal posture. Extremities:  Without edema. Neurologic:  Alert and  oriented x4;  grossly normal neurologically. Skin:  Intact without significant lesions or rashes. Psych:  Alert and cooperative. Normal mood and affect.   Assessment   James Williams is a 36 y.o. male presenting today in follow-up with a history of abdominal pain, weight loss, N/V. EGD/colonoscopy, duodenal biopsies unremarkable. GES normal, normal fasting cortisol level. Started on amitriptyline and continued on Nexium in the past. He is here today for med refills.   Interesting constellation of symptoms previously reported, with improvement noted on amitriptyline and Nexium. However, he still reports spells of dizziness, "bubbles" in esophagus, weak/shake  when going a long time without eating. These spells can last from 20 minutes to an hour.   He is requesting alpha gal panel to be checked again. Prior extensive evaluation as noted above, including head CT.   I am not convinced this is GI-related. He may need endocrine evaluation. Does not seem to have any cardiac symptoms.     PLAN    A1c and alpha gal panel Refills provided for amitriptyline and Nexium Further recommendations to follow    31, PhD, ANP-BC [facility]   I have reviewed the note and agree with the APP's assessment as described in this progress note  Patient presenting with interesting symptoms, unusual presentation which notably has improved with amitriptyline and PPI - this may correlate with a possible functional etiology. Will continue same regimen for now  [facility], MD Gastroenterology and Hepatology [facility]astro[facility]ology

## 2022-10-06 NOTE — Patient Instructions (Signed)
Please have blood work done when you are able.  I have refilled medications for you. I would recommend weaning off amitriptyline, taking it every other day for the next week then off.   Further recommendations to follow!  It was a pleasure to see you today. I want to create trusting relationships with patients to provide genuine, compassionate, and quality care. I value your feedback. If you receive a survey regarding your visit,  I greatly appreciate you taking time to fill this out.   Gelene Mink, PhD, ANP-BC [facility] Gastroenter[facility]

## 2022-10-08 LAB — HEMOGLOBIN A1C
Est. average glucose Bld gHb Est-mCnc: 111 mg/dL
Hgb A1c MFr Bld: 5.5 % (ref 4.8–5.6)

## 2022-10-10 ENCOUNTER — Other Ambulatory Visit: Payer: Self-pay

## 2022-10-10 ENCOUNTER — Other Ambulatory Visit (HOSPITAL_COMMUNITY): Payer: Self-pay

## 2022-10-10 LAB — ALPHA-GAL PANEL
Allergen Lamb IgE: <0.1 kU/L
Beef IgE: 0.16 kU/L — AB
IgE (Immunoglobulin E), Serum: 36 IU/mL (ref 6–495)
O215-IgE Alpha-Gal: 0.34 kU/L — AB
Pork IgE: <0.1 kU/L

## 2022-10-10 MED FILL — Meclizine HCl Tab 25 MG: ORAL | 90 days supply | Qty: 90 | Fill #0 | Status: AC

## 2022-10-18 ENCOUNTER — Other Ambulatory Visit (HOSPITAL_COMMUNITY): Payer: Self-pay

## 2022-12-08 ENCOUNTER — Other Ambulatory Visit (HOSPITAL_BASED_OUTPATIENT_CLINIC_OR_DEPARTMENT_OTHER): Payer: Self-pay

## 2022-12-08 DIAGNOSIS — Z6835 Body mass index (BMI) 35.0-35.9, adult: Secondary | ICD-10-CM | POA: Diagnosis not present

## 2022-12-08 DIAGNOSIS — R030 Elevated blood-pressure reading, without diagnosis of hypertension: Secondary | ICD-10-CM | POA: Diagnosis not present

## 2022-12-08 DIAGNOSIS — R059 Cough, unspecified: Secondary | ICD-10-CM | POA: Diagnosis not present

## 2022-12-08 DIAGNOSIS — J069 Acute upper respiratory infection, unspecified: Secondary | ICD-10-CM | POA: Diagnosis not present

## 2022-12-08 MED ORDER — BENZONATATE 100 MG PO CAPS
100.0000 mg | ORAL_CAPSULE | Freq: Three times a day (TID) | ORAL | 0 refills | Status: DC | PRN
  Filled 2022-12-08: qty 20, 7d supply, fill #0

## 2022-12-08 MED ORDER — PREDNISONE 20 MG PO TABS
40.0000 mg | ORAL_TABLET | Freq: Every day | ORAL | 0 refills | Status: DC
  Filled 2022-12-08: qty 6, 3d supply, fill #0

## 2022-12-15 IMAGING — US US ABDOMEN LIMITED
1 series · 14 of 25 positions shown · non-contrast
Comparison: Abdominal ultrasound April 16, 2020

CLINICAL DATA: Chronic cholecystitis with gallstone

EXAM:
ULTRASOUND ABDOMEN LIMITED RIGHT UPPER QUADRANT

[Series 1: us abdomen limited · 0.30mm/px · 14 of 61 slices shown]
[im 1/61]
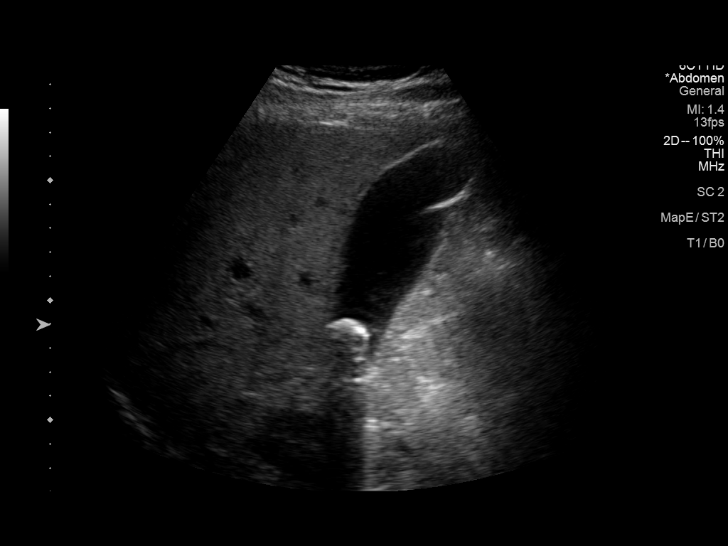
[im 6/61]
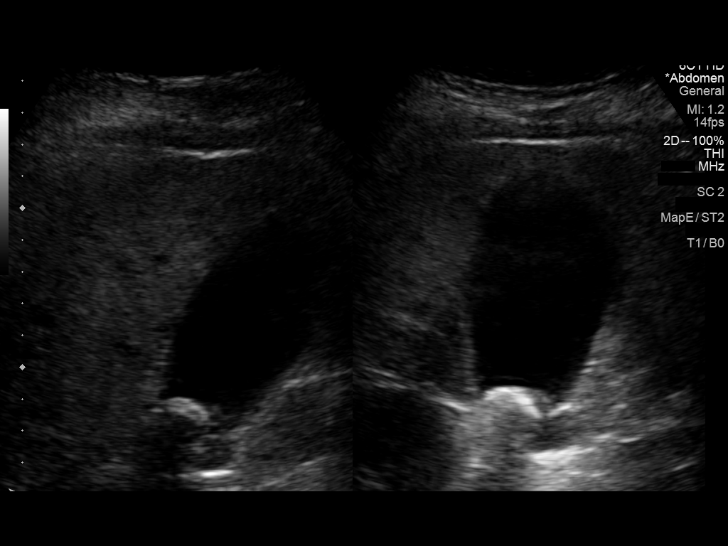
[im 11/61]
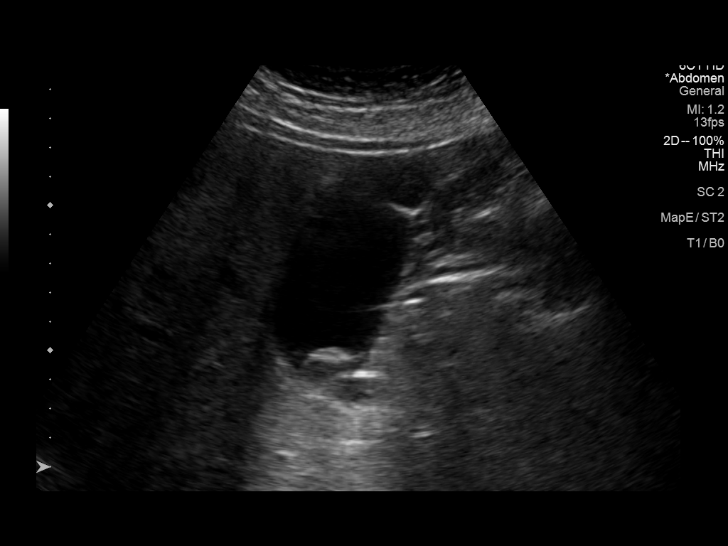
[im 16/61]
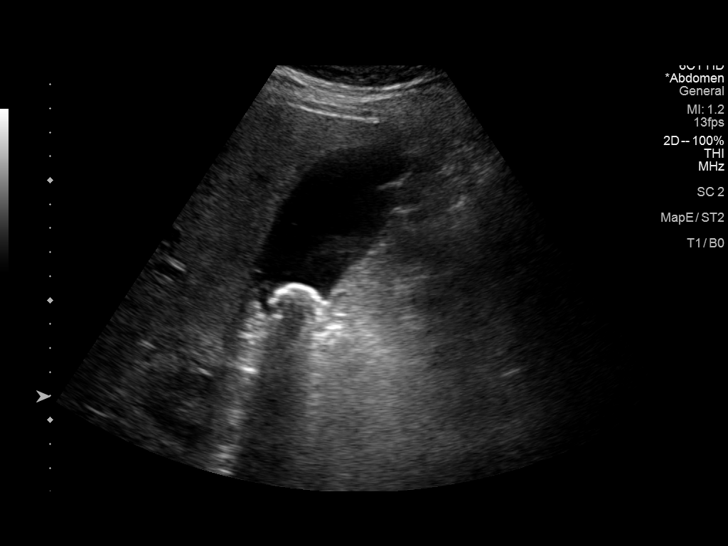
[im 21/61]
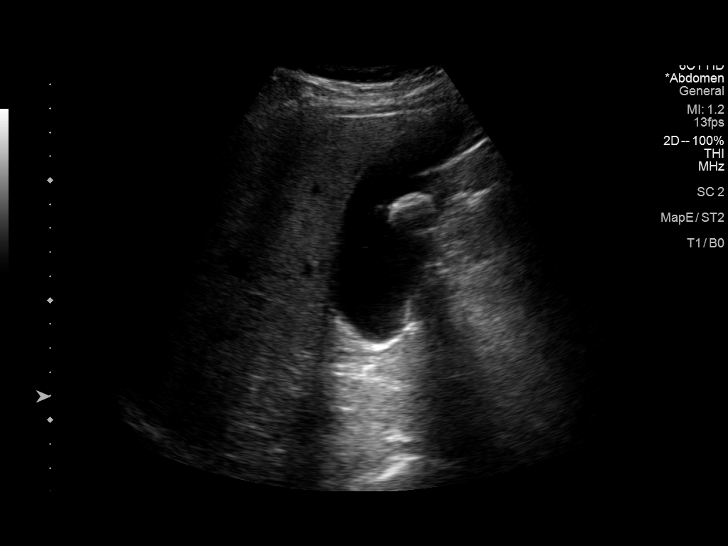
[im 23/61]
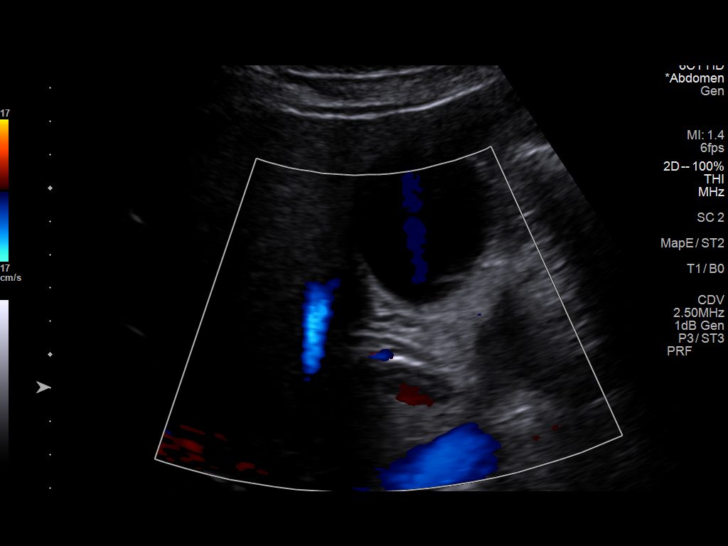
[im 28/61]
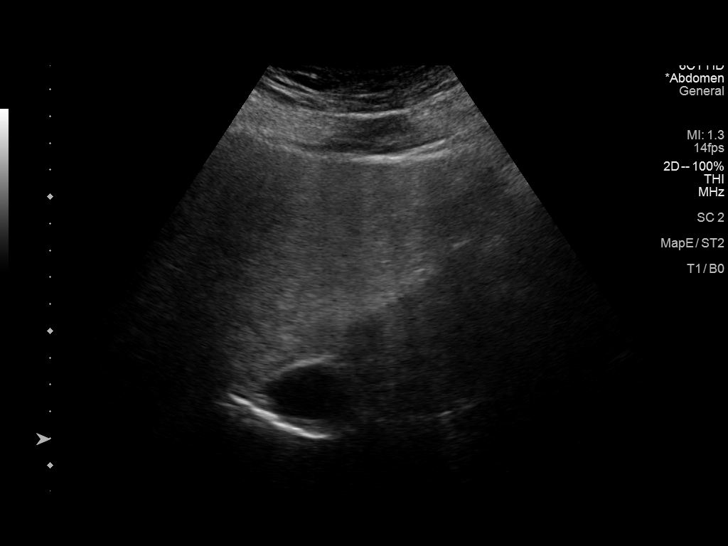
[im 33/61]
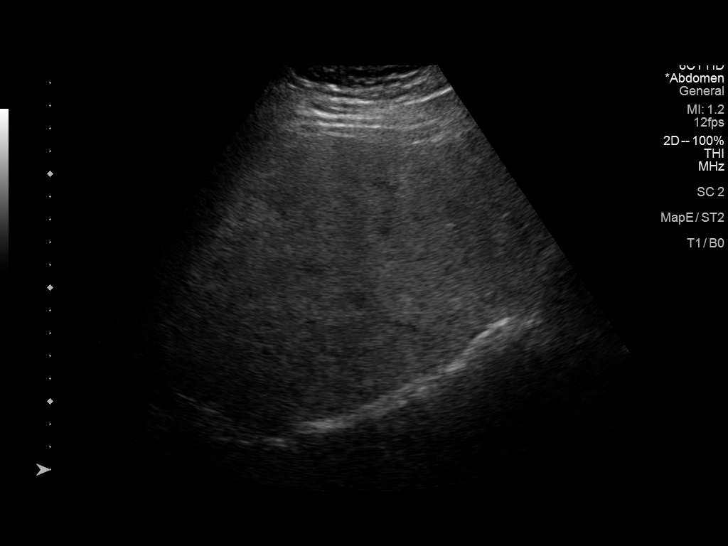
[im 38/61]
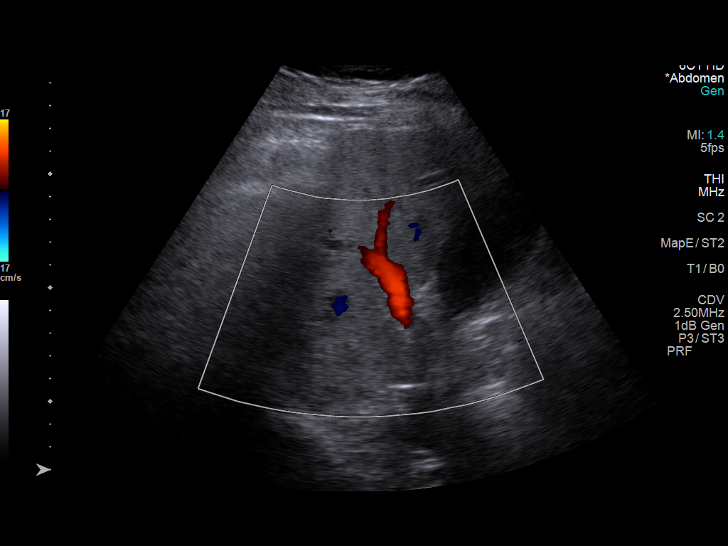
[im 41/61]
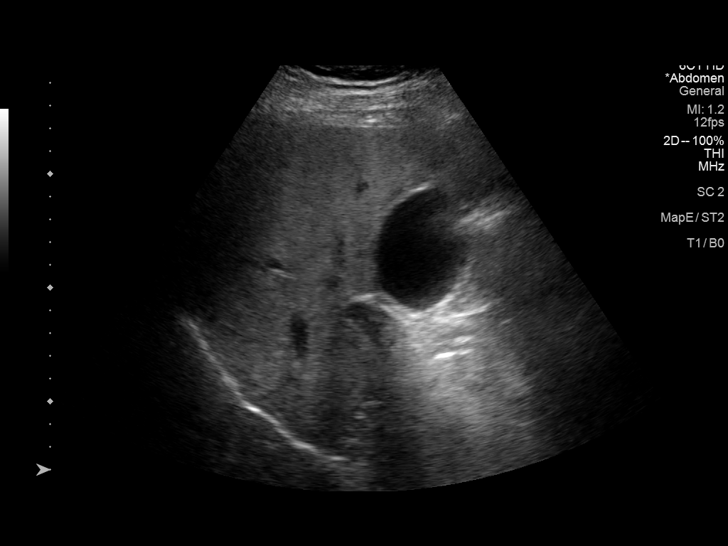
[im 46/61]
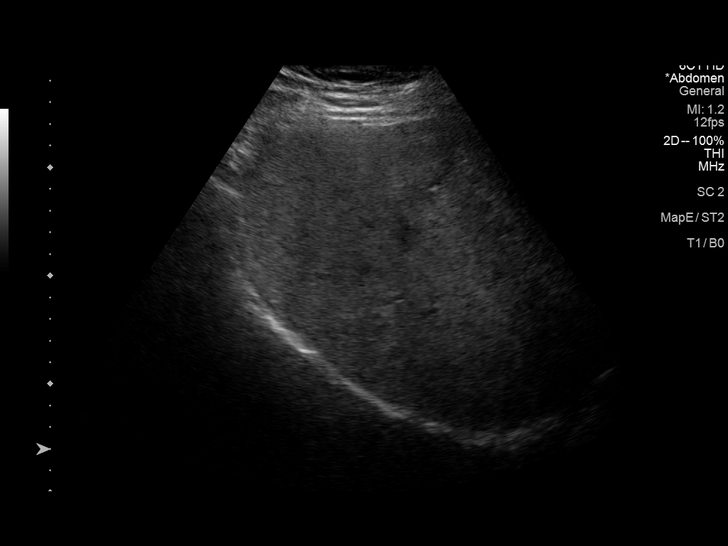
[im 51/61]
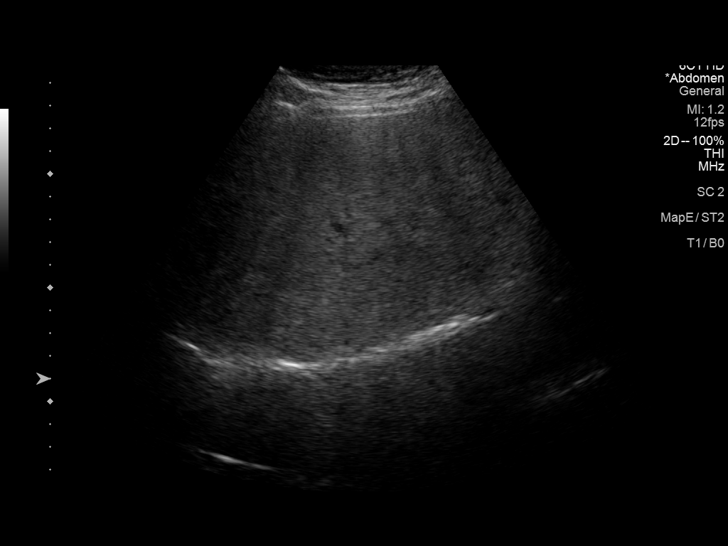
[im 56/61]
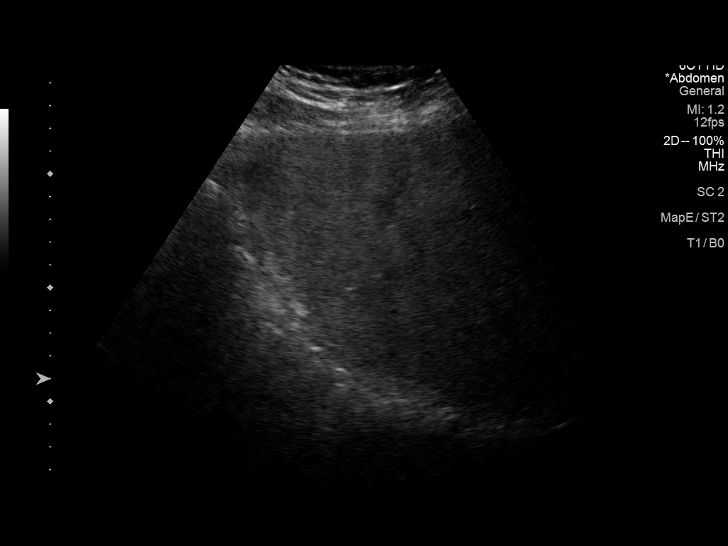
[im 61/61]
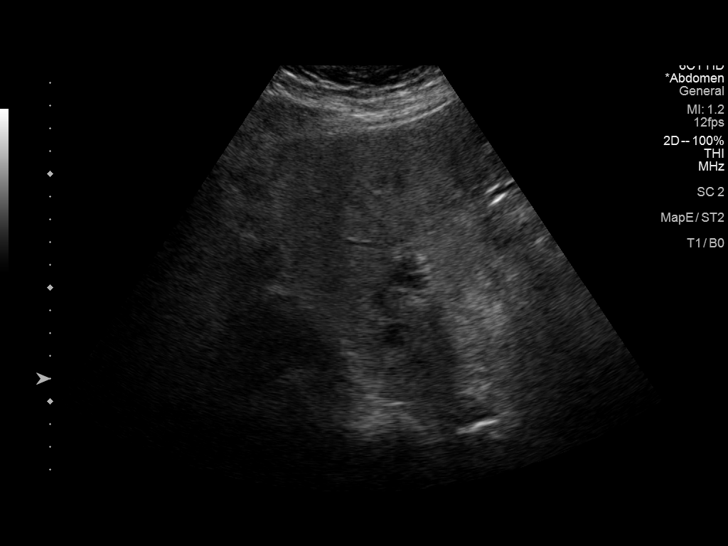

[14 of 25 positions shown; findings below may reference images not displayed]

FINDINGS: Gallbladder:

One mobile 1.9 cm gallstone at the fundus of the gallbladder.
Previously visualized 6 mm gallbladder polyp is not seen on today's
examination. Normal gallbladder wall thickness. (1.9 mm). No
sonographic Murphy sign noted by sonographer.

Common bile duct:

Diameter: 3 mm

Liver:

No focal lesion identified. The liver shows diffusely increased
heterogeneous echogenicity with decreased through-transmission.
Portal vein is patent on color Doppler imaging with normal direction
of blood flow towards the liver.

Other: None.
IMPRESSION: 1. Cholelithiasis without sonographic evidence of acute
cholecystitis. If concern for chronic cholecystitis recommend
further evaluation with nuclear medicine HIDA scan.
2. The liver shows diffusely increased echogenicity with decreased
through-transmission. This is a nonspecific finding but is most
commonly seen with fatty infiltration of the liver.

## 2022-12-30 ENCOUNTER — Other Ambulatory Visit: Payer: Self-pay | Admitting: Gastroenterology

## 2022-12-30 ENCOUNTER — Other Ambulatory Visit (HOSPITAL_COMMUNITY): Payer: Self-pay

## 2023-01-04 ENCOUNTER — Other Ambulatory Visit (HOSPITAL_COMMUNITY): Payer: Self-pay

## 2023-01-04 MED ORDER — MECLIZINE HCL 25 MG PO TABS
25.0000 mg | ORAL_TABLET | Freq: Every day | ORAL | 0 refills | Status: DC
  Filled 2023-01-04: qty 90, 90d supply, fill #0

## 2023-02-01 IMAGING — RF DG CHOLANGIOGRAM OPERATIVE
1 series · 5 of 5 positions shown · non-contrast
Comparison: 11/13/2020

CLINICAL DATA: 34-year-old male with history of cholelithiasis and
chronic cholecystitis.

EXAM:
INTRAOPERATIVE CHOLANGIOGRAM
TECHNIQUE: Cholangiographic images from the C-arm fluoroscopic device were
submitted for interpretation post-operatively. Please see the
procedural report for the amount of contrast and the fluoroscopy
time utilized.

[Series 1: run · 2 acquisitions, 5 frames shown]
[im 1/2]
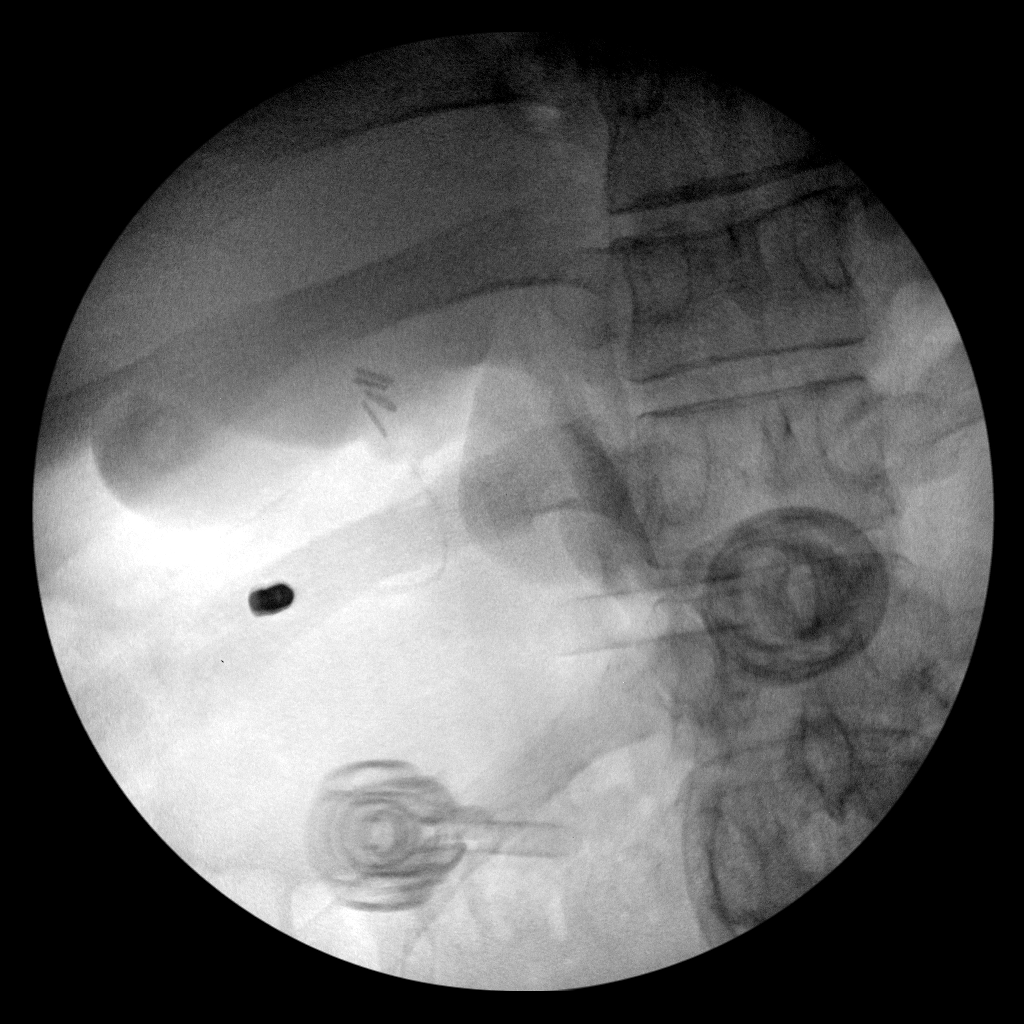
[im 2/2]
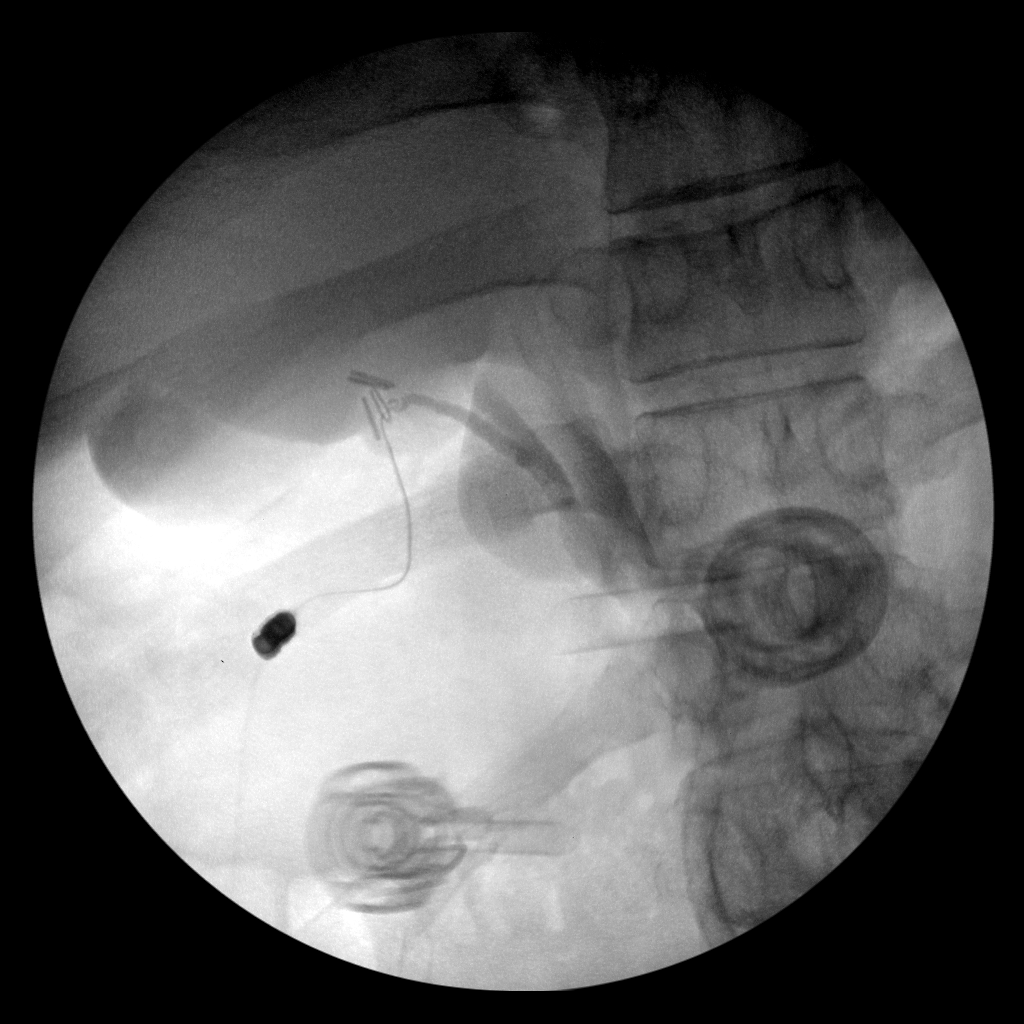
[im 2/2]
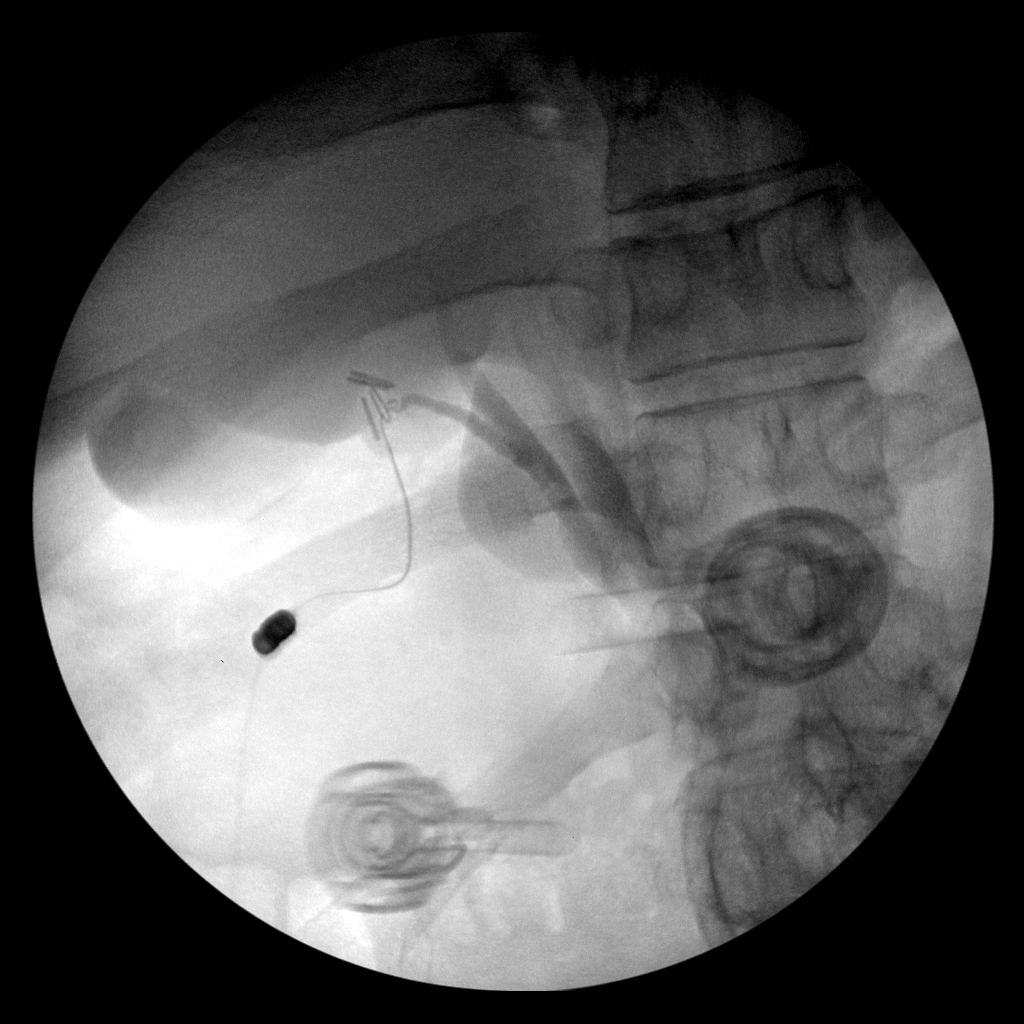
[im 2/2]
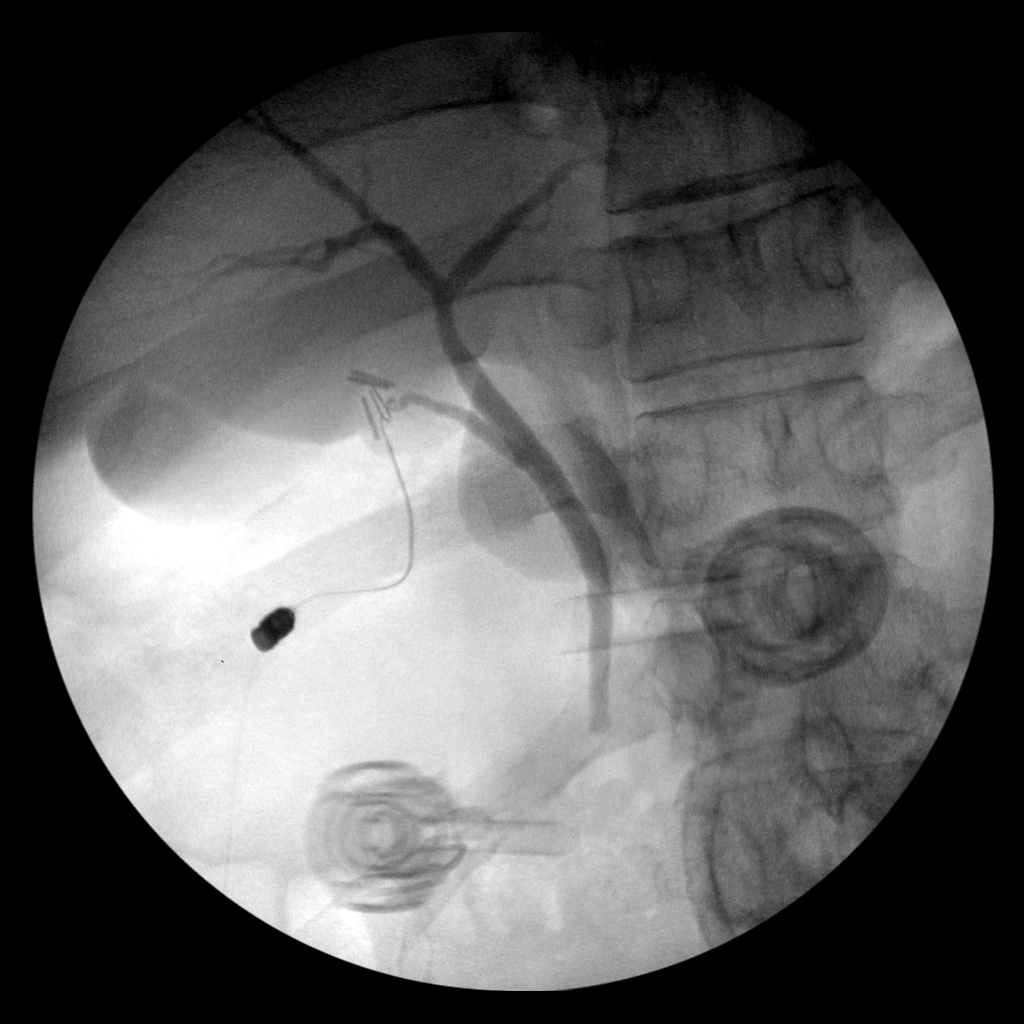
[im 2/2]
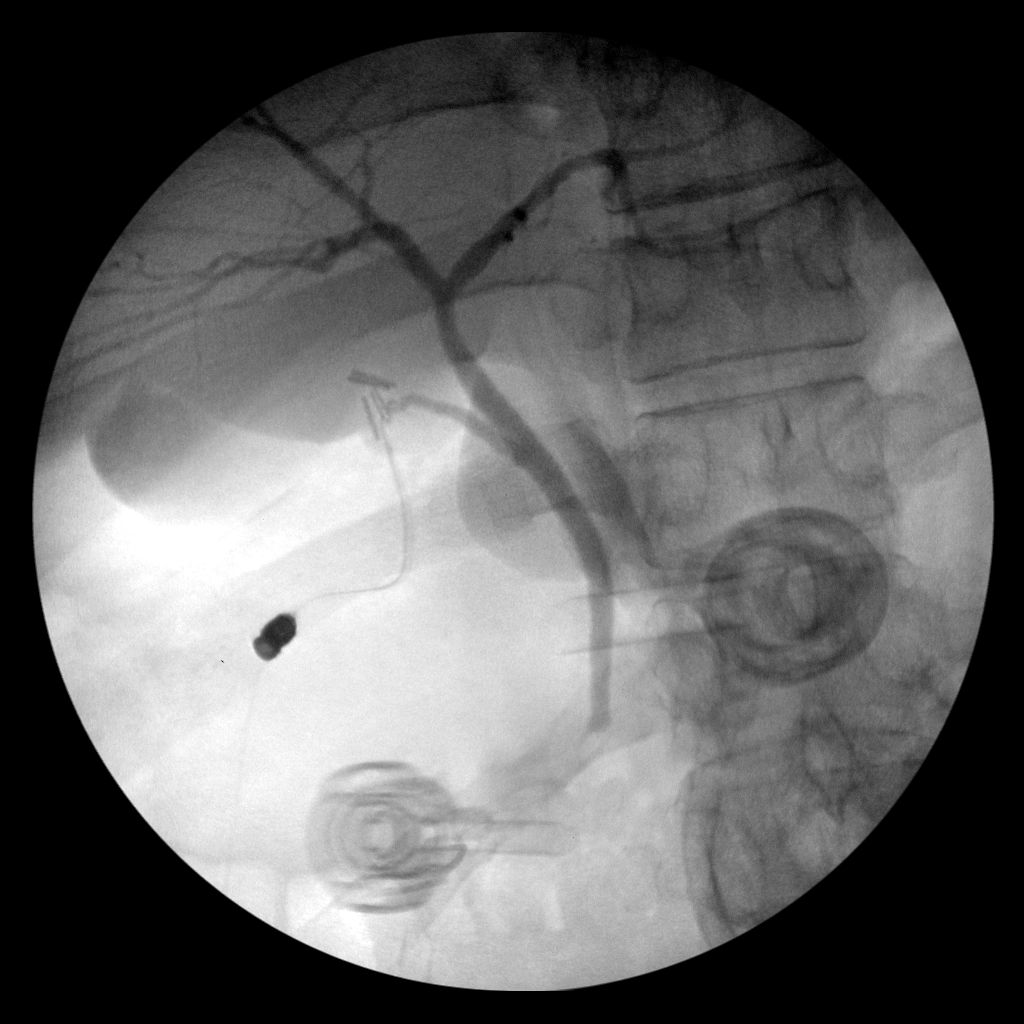

[5 of 5 positions shown; findings below may reference images not displayed]

FINDINGS: Intraoperative antegrade injection via the cystic duct which
opacifies the common bile duct and central portions of the
intrahepatic biliary tree. Contrast is visualized flowing freely
into the duodenum. There are no filling defects. No significant
intra or extrahepatic biliary ductal dilation. No apparent anomalous
anatomical configuration of the biliary tree.
IMPRESSION: No evidence of choledocholithiasis.

## 2023-05-30 DIAGNOSIS — Z1329 Encounter for screening for other suspected endocrine disorder: Secondary | ICD-10-CM | POA: Diagnosis not present

## 2023-05-30 DIAGNOSIS — Z0000 Encounter for general adult medical examination without abnormal findings: Secondary | ICD-10-CM | POA: Diagnosis not present

## 2023-05-30 DIAGNOSIS — Z13220 Encounter for screening for lipoid disorders: Secondary | ICD-10-CM | POA: Diagnosis not present

## 2023-06-07 DIAGNOSIS — Z6834 Body mass index (BMI) 34.0-34.9, adult: Secondary | ICD-10-CM | POA: Diagnosis not present

## 2023-06-07 DIAGNOSIS — Z0000 Encounter for general adult medical examination without abnormal findings: Secondary | ICD-10-CM | POA: Diagnosis not present

## 2023-06-07 DIAGNOSIS — R7989 Other specified abnormal findings of blood chemistry: Secondary | ICD-10-CM | POA: Diagnosis not present

## 2023-06-07 DIAGNOSIS — R030 Elevated blood-pressure reading, without diagnosis of hypertension: Secondary | ICD-10-CM | POA: Diagnosis not present

## 2023-09-07 IMAGING — CT CT HEAD WO/W CM
4 of 5 series · 16 of 47 positions shown, 18 images · IV contrast (APPLIED)
Comparison: None.

CLINICAL DATA: Vertigo, unexplained nausea and vomiting

EXAM:
CT HEAD WITHOUT AND WITH CONTRAST
TECHNIQUE: Contiguous axial images were obtained from the base of the skull
through the vertex without and with intravenous contrast
CONTRAST:  75mL OMNIPAQUE IOHEXOL 300 MG/ML  SOLN

[Series 2: head wo · axial · 0.46mm/px · z∈[+1063,+1183]mm · 6 of 34 slices shown, 8 images]
[im 5/34  brain]
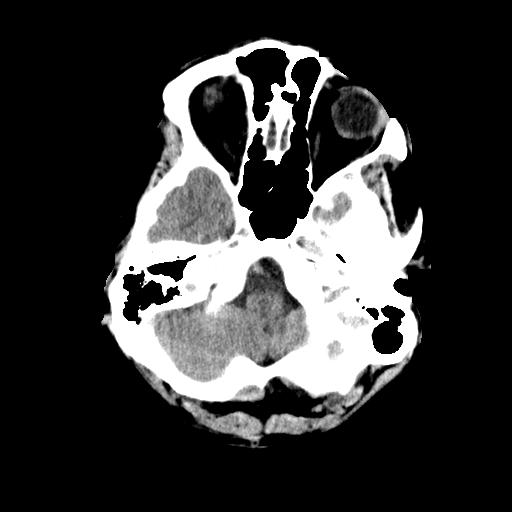
[im 5/34  bone]
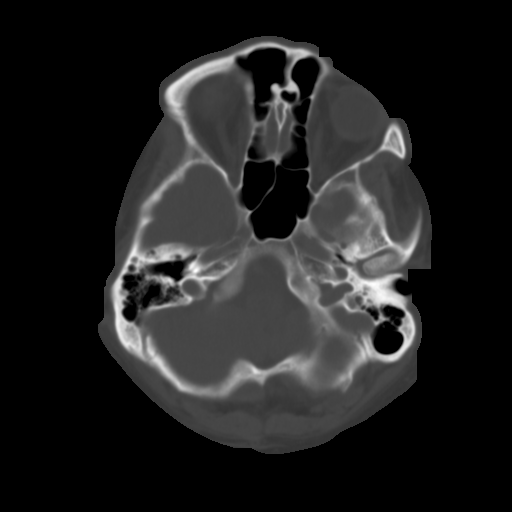
[im 10/34  brain]
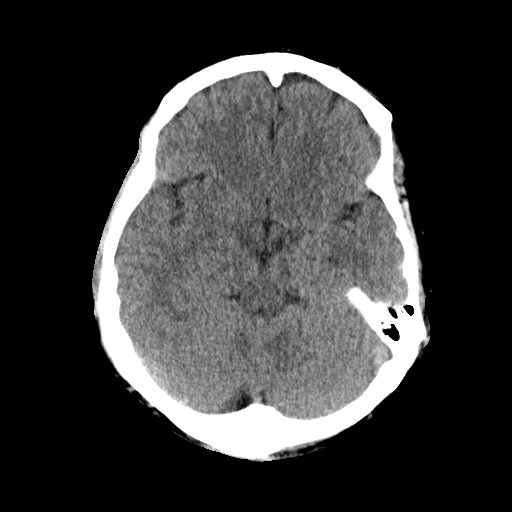
[im 15/34  brain]
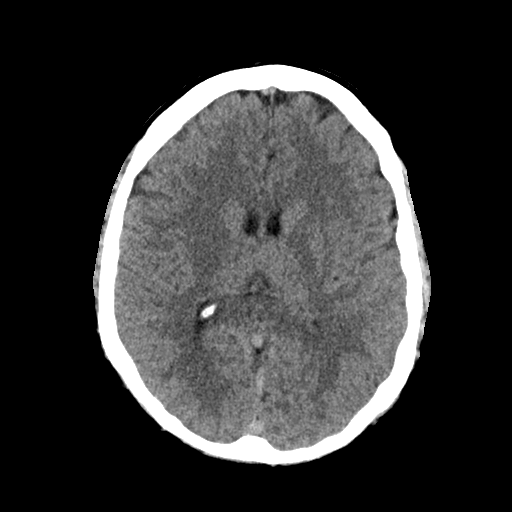
[im 19/34  brain]
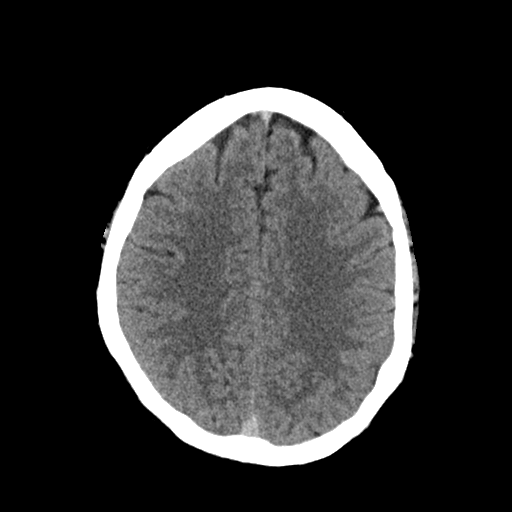
[im 24/34  brain]
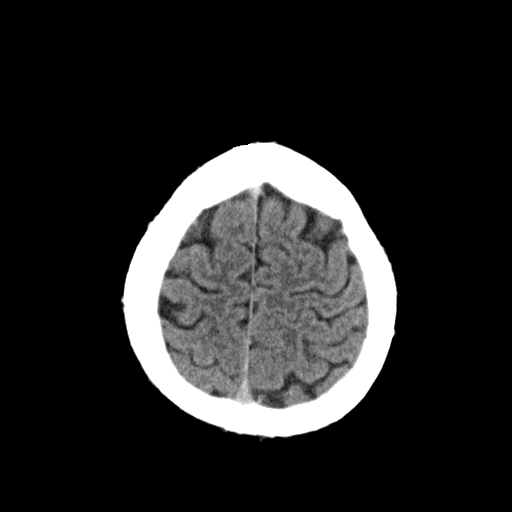
[im 24/34  bone]
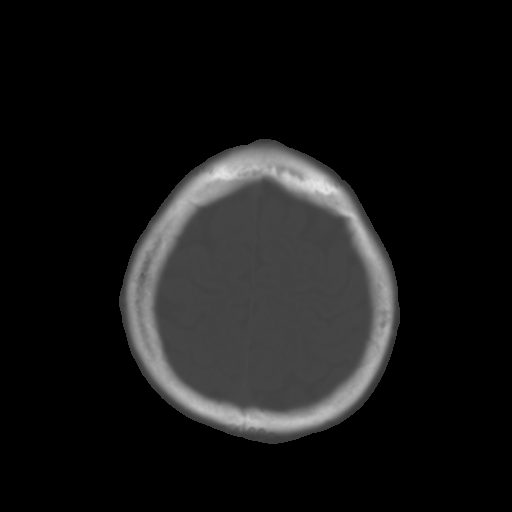
[im 29/34  brain]
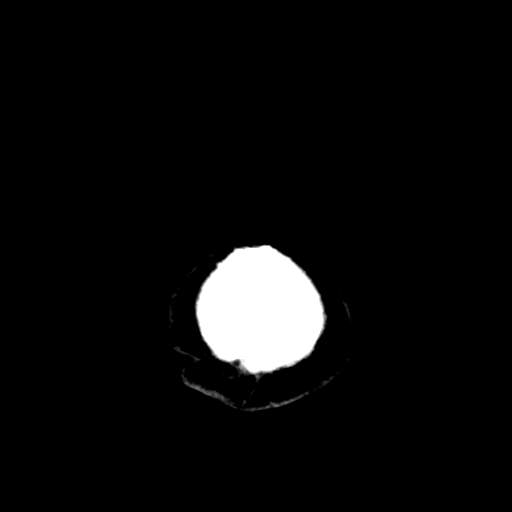

[Series 3: head w · axial · 0.45mm/px · z∈[+1079,+1154]mm · 4 of 27 slices shown]
[im 6/27  brain]
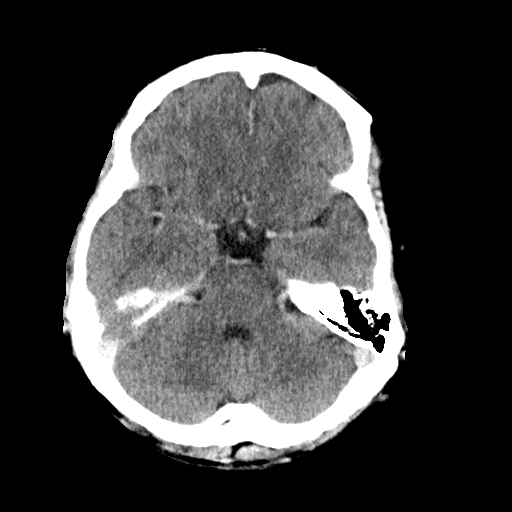
[im 11/27  brain]
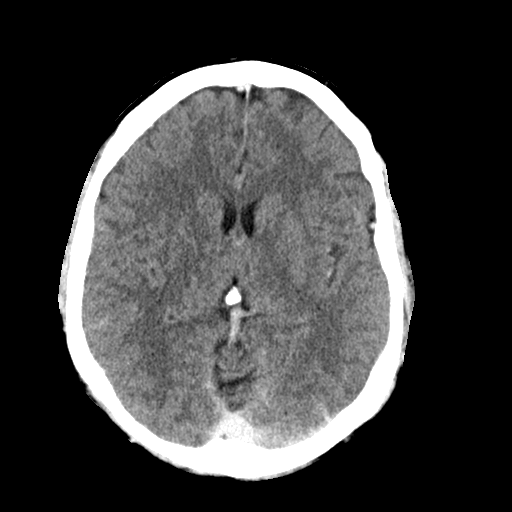
[im 16/27  brain]
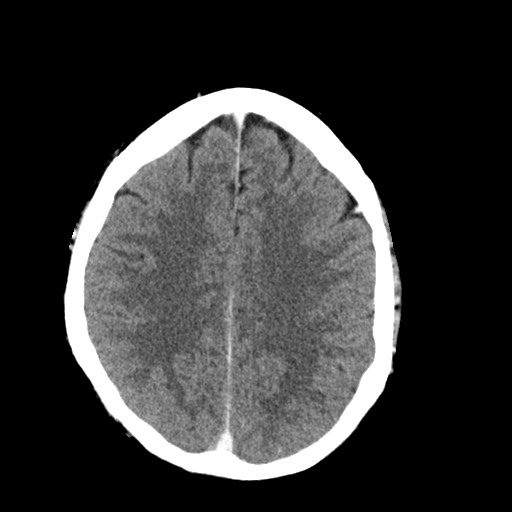
[im 21/27  brain]
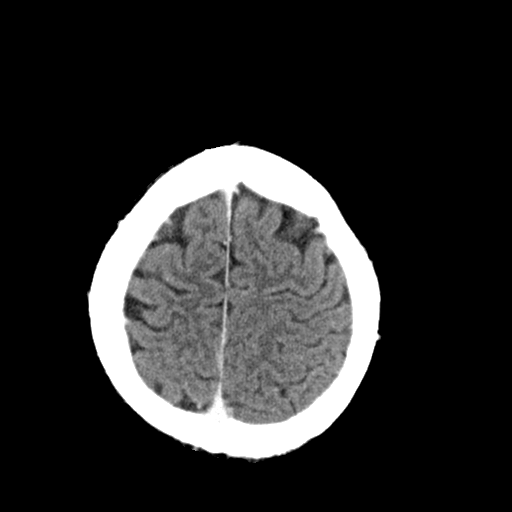

[Series 5: coronal soft · coronal · 0.33mm/px · 3 of 72 slices shown]
[im 24/72  brain]
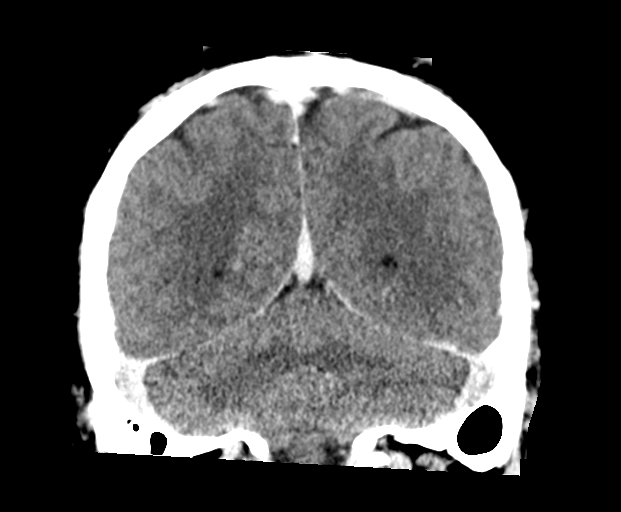
[im 32/72  brain]
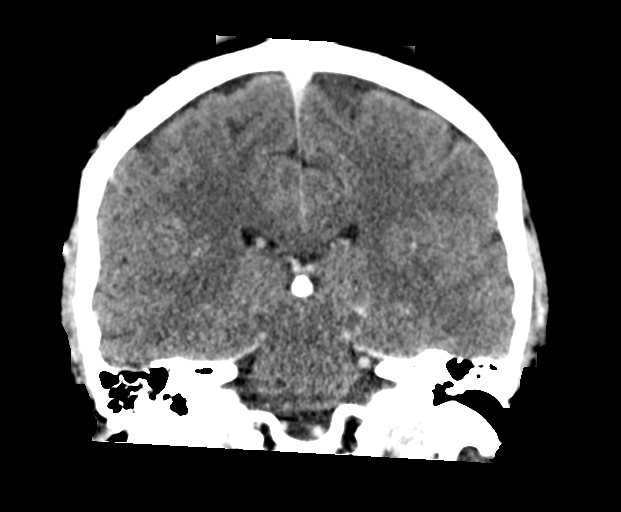
[im 40/72  brain]
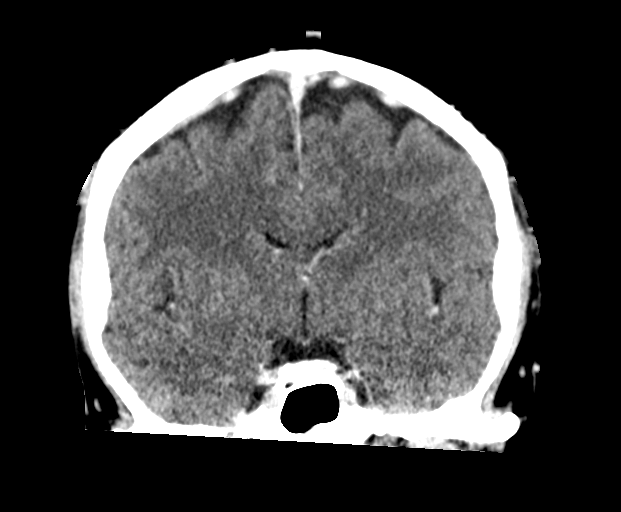

[Series 6: sagittal soft · sagittal · 0.33mm/px · 3 of 60 slices shown]
[im 20/60  brain]
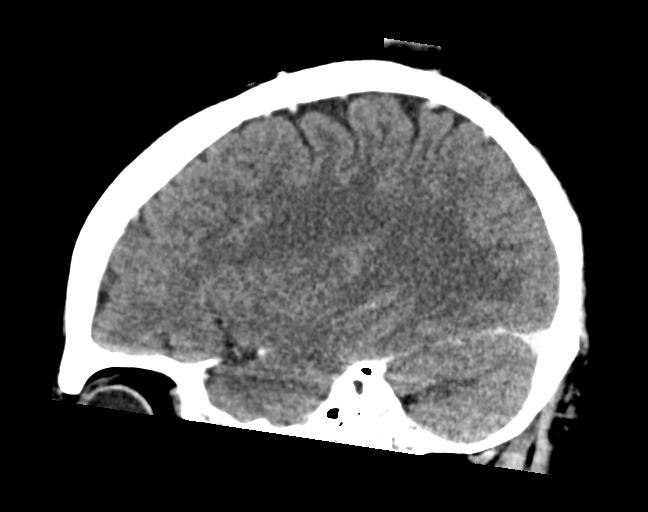
[im 30/60  brain]
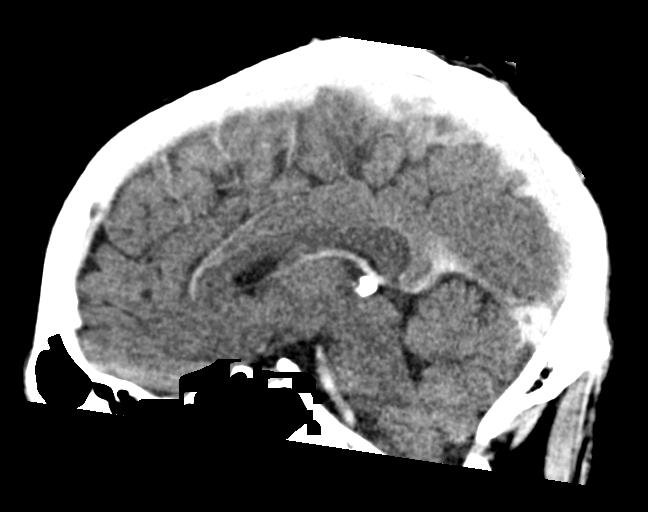
[im 40/60  brain]
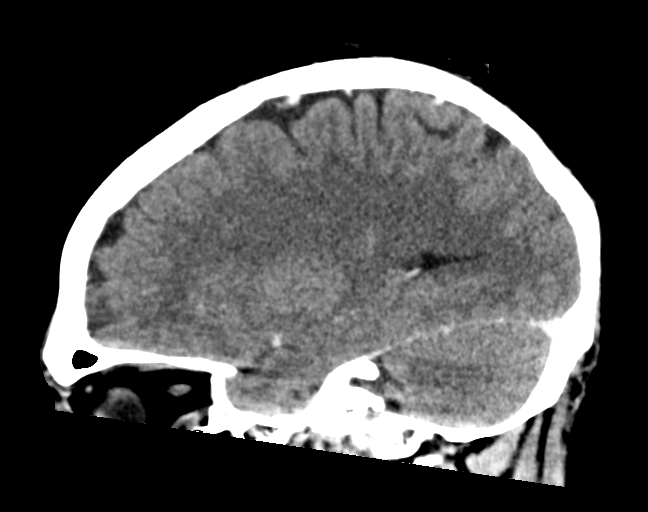

[16 of 47 positions shown; findings below may reference images not displayed]

FINDINGS: Brain: No evidence of acute infarction, hemorrhage, hydrocephalus,
extra-axial collection or mass lesion/mass effect. No abnormal
enhancement.

Vascular: No hyperdense vessel or unexpected calcification. Visible
vessels are patent.

Skull: Normal. Negative for fracture or focal lesion.

Sinuses/Orbits: No acute finding.

Other: The mastoids are well aerated.
IMPRESSION: No acute intracranial process. No etiology is seen for the patient's
vertigo or nausea.

## 2023-10-12 ENCOUNTER — Encounter: Payer: Self-pay | Admitting: Gastroenterology

## 2023-10-12 ENCOUNTER — Ambulatory Visit: Payer: 59 | Admitting: Gastroenterology

## 2023-10-12 ENCOUNTER — Other Ambulatory Visit (HOSPITAL_COMMUNITY): Payer: Self-pay

## 2023-10-12 VITALS — BP 107/70 | HR 61 | Temp 98.5°F | Ht 73.0 in | Wt 272.7 lb

## 2023-10-12 DIAGNOSIS — R42 Dizziness and giddiness: Secondary | ICD-10-CM

## 2023-10-12 DIAGNOSIS — R140 Abdominal distension (gaseous): Secondary | ICD-10-CM

## 2023-10-12 DIAGNOSIS — R112 Nausea with vomiting, unspecified: Secondary | ICD-10-CM | POA: Diagnosis not present

## 2023-10-12 DIAGNOSIS — R143 Flatulence: Secondary | ICD-10-CM

## 2023-10-12 DIAGNOSIS — R109 Unspecified abdominal pain: Secondary | ICD-10-CM | POA: Insufficient documentation

## 2023-10-12 DIAGNOSIS — R634 Abnormal weight loss: Secondary | ICD-10-CM | POA: Diagnosis not present

## 2023-10-12 DIAGNOSIS — R1010 Upper abdominal pain, unspecified: Secondary | ICD-10-CM

## 2023-10-12 DIAGNOSIS — R1013 Epigastric pain: Secondary | ICD-10-CM

## 2023-10-12 MED ORDER — RIFAXIMIN 550 MG PO TABS
550.0000 mg | ORAL_TABLET | Freq: Three times a day (TID) | ORAL | 0 refills | Status: AC
  Filled 2023-10-12 – 2023-10-20 (×2): qty 42, 14d supply, fill #0

## 2023-10-12 NOTE — Progress Notes (Addendum)
Gastroenterology Office Note     Primary Care Physician:  Richardean Chimera, MD  Primary Gastroenterologist: Dr. Levon Hedger, previously Dr. Karilyn Cota    Chief Complaint   Chief Complaint  Patient presents with   Follow-up    Pt arrives for follow up for continuing GI issues. Pt also has brought in FMLA due to being late for work due to sickness in mornings.      History of Present Illness   James Williams is a 37 y.o. male presenting today with a history of abdominal pain, weight loss, N/V. EGD/colonoscopy, duodenal biopsies unremarkable. GES normal, normal fasting cortisol level. Started on amitriptyline and continued on Nexium in the past.    Previously seen by Dr. Karilyn Cota, establishing care in Aug 2022. Noting chronic spells for over 4 years usually in morning after voiding. Shortly after voiding, would note nausea, dizzy, lightheaded, occasional vomiting, weakness.   Weight 289 at last visit in Dec 2023. Today 272. States this accelerated over the last 2 months. Weight was in the 240s in 2022.    Noting symptoms later in the day. Convinced it may be due to gas. Woke up last night with burping. 65% of the time wakes up in morning feels dizzy and lightheaded, stomach pain in epigastric, has had such severe GERD thought was heart attack. Chest hurting in the mornings but goes away. Nausea usually in the mornings. Fights to keep from throwing up. Nausea seems to follow dizziness. Wife states abdomen distended in mornings. With pain feels hot flash like has to have BM then goes away for 10 minutes then back again. Happens for 2-3 hours in the morning.   Usually symptoms occur after urinating, then halfway back to bedroom feels dizzy. Feels abdomen gurgling. Most of pain is epigastric and goes over and down LUQ to left side. Most of the time epigastric pain the worst. By around 830 or 9am symptoms start fading and is fine. Occasionally has stomach gurgling and not as severe later in the  afternoon. Loss of appetite with pain. Doesn't want to eat. If fasts the day before is very rarely the next day. Stopped taking amitriptyline. Had noted improvement on amitriptyline which helped decrease by 50% but then lost efficacy.  Would still get sick but not as bad. Tried gas meds at night like gas x. Doesn't notice improvement with this. No postprandial pain after eating once day progresses. No typical GERD symptoms currently. Normally eats at 6pm. Rare to eat past this. Usually lays down around 9-10 pm.   Bowels in the past (5 years ago) was three times a day. Now BM usually in the morning with multiple soft stools and lots of gas in between each movement. Has to push hard a lot of the times.   Referral to endocrine is upcoming.    Prior evaluation:   Alpha gal panel with equivocal response to beef.   CT head with and without contrast 2022 normal.   CT abd/pelvis with contrast negative 2022  GES normal in 2022  Cortisol normal 2022.   Aug 2022 colonoscopy: normal TI, sigmoid diverticulosis, external hemorrhoids. Next colonoscopy age 69.   EGD Aug 2022: normal esophagus, multiple tiny telangieactasia at gastric body without bleeding s/p biopsy, erythematous antrum, normal duodenum s/p biopsy. Biopsies negative for H.pylori or eosinophilic gastritis. Normal duodenum.     Past Medical History:  Diagnosis Date   Allergy to alpha-gal    Mammal meat allergy    Past Surgical History:  Procedure Laterality Date   BIOPSY  06/02/2021   Procedure: BIOPSY;  Surgeon: Malissa Hippo, MD;  Location: [facility];  Service: Endoscopy;;   CHOLECYSTECTOMY N/A 12/31/2020   Procedure: LAPAROSCOPIC CHOLECYSTECTOMY WITH INTRAOPERATIVE CHOLANGIOGRAM;  Surgeon: Luretha Murphy, MD;  Location: [facility];  Service: General;  Laterality: N/A;   COLONOSCOPY WITH PROPOFOL N/A 06/02/2021   Procedure: COLONOSCOPY WITH PROPOFOL;  Surgeon: Malissa Hippo, MD;  Location: [facility];  Service:  Endoscopy;  Laterality: N/A;  1:30   ESOPHAGOGASTRODUODENOSCOPY (EGD) WITH PROPOFOL N/A 06/02/2021   Procedure: ESOPHAGOGASTRODUODENOSCOPY (EGD) WITH PROPOFOL;  Surgeon: Malissa Hippo, MD;  Location: [facility];  Service: Endoscopy;  Laterality: N/A;   WISDOM TOOTH EXTRACTION      No current outpatient medications on file.   No current facility-administered medications for this visit.    Allergies as of 10/12/2023   (No Known Allergies)    No family history on file.  Social History   Socioeconomic History   Marital status: Married    Spouse name: Not on file   Number of children: Not on file   Years of education: Not on file   Highest education level: Not on file  Occupational History   Not on file  Tobacco Use   Smoking status: Never   Smokeless tobacco: Never  Vaping Use   Vaping status: Never Used  Substance and Sexual Activity   Alcohol use: Yes    Comment: one beer a month   Drug use: Not Currently    Types: Marijuana   Sexual activity: Yes    Birth control/protection: None  Other Topics Concern   Not on file  Social History Narrative   ** Merged History Encounter **       Social Drivers of Corporate investment banker Strain: Not on file  Food Insecurity: Not on file  Transportation Needs: Not on file  Physical Activity: Not on file  Stress: Not on file  Social Connections: Not on file  Intimate Partner Violence: Not on file     Review of Systems   Gen: Denies any fever, chills, fatigue, weight loss, lack of appetite.  CV: Denies chest pain, heart palpitations, peripheral edema, syncope.  Resp: Denies shortness of breath at rest or with exertion. Denies wheezing or cough.  GI: Denies dysphagia or odynophagia. Denies jaundice, hematemesis, fecal incontinence. GU : Denies urinary burning, urinary frequency, urinary hesitancy MS: Denies joint pain, muscle weakness, cramps, or limitation of movement.  Derm: Denies rash, itching, dry skin Psych:  Denies depression, anxiety, memory loss, and confusion Heme: Denies bruising, bleeding, and enlarged lymph nodes.   Physical Exam   BP 107/70   Pulse 61   Temp 98.5 F (36.9 C)   Ht 6\' 1"  (1.854 m)   Wt 272 lb 11.2 oz (123.7 kg)   BMI 35.98 kg/m  General:   Alert and oriented. Pleasant and cooperative. Well-nourished and well-developed.  Head:  Normocephalic and atraumatic. Eyes:  Without icterus Abdomen:  +BS, soft, non-tender and non-distended. No HSM noted. No guarding or rebound. No masses appreciated.  Rectal:  Deferred  Msk:  Symmetrical without gross deformities. Normal posture. Extremities:  Without edema. Neurologic:  Alert and  oriented x4;  grossly normal neurologically. Skin:  Intact without significant lesions or rashes. Psych:  Alert and cooperative. Normal mood and affect.   Assessment   James Williams is a 37 y.o. male presenting today with a history of  abdominal pain, weight  loss, N/V, and with an extensive evaluation previously as noted above. Now presenting with worsening symptoms of dyspepsia, bloating, gas, nausea.  Interestingly, he did note some symptom improvement on amitriptyline and PPI in the past of about 50%, but then this lost efficacy. I agree with referral to Endocrinology, as he has some interesting symptoms that are not entirely GI related. I will also refer to Cardiology due to dizziness upon standing, nausea, weakness, ?POTS, unclear etiology.   From a GI standpoint, we can update CT due to worsening dyspepsia and weight loss; however, he has no postprandial symptoms later in the day and primarily occurs early in the morning. Will do CT with pancreatic protocol to be thorough. I doubt an EGD will shed light on this, but we can certainly update this in the future following the CT scan.   To cover all the bases, we can also do a round of Xifaxan empirically for ?IBS-D, SIBO.   He may benefit from a different TCA after ruling out other GI  etiologies. Could consider Buspar as this can help with gastric accomodation, although he does not have any pain as the day progresses; I am holding off on a PPI as well as no typical GERD symptoms, although this may be needed in future.      PLAN    CT abd/pelvis with and without contrast Referral to Cardiology Keep upcoming appt with endocrine Consider Buspar May need to trial PPI again Round of Xifaxan TID for 14 days Further recommendations to follow  Follow-up in 6-8 weeks FMLA forms completed    Gelene Mink, PhD, ANP-BC [facility]   I have reviewed the note and agree with the APP's assessment as described in this progress note  Katrinka Blazing, MD Gastroenterology and Hepatology [facility]

## 2023-10-12 NOTE — Patient Instructions (Signed)
We are arranging a dedicated CT scan of your pancreas (it will also show your other organs).  I am referring you to cardiology.  I will fill out your forms and have ready in next few days.   We will see you in 6-8 weeks!  I enjoyed seeing you again today! I value our relationship and want to provide genuine, compassionate, and quality care. You may receive a survey regarding your visit with me, and I welcome your feedback! Thanks so much for taking the time to complete this. I look forward to seeing you again.      Gelene Mink, PhD, ANP-BC [facility]

## 2023-10-13 ENCOUNTER — Telehealth (INDEPENDENT_AMBULATORY_CARE_PROVIDER_SITE_OTHER): Payer: Self-pay | Admitting: Gastroenterology

## 2023-10-13 NOTE — Telephone Encounter (Signed)
No auth needed for CT. Pt scheduled at [facility] for 10/19/23 at 3:00pm. Pt is to arrive at 12:45pm Main Entrance. No solid foods 4 hours prior, liquids only. Left message on wifes number to return call. Will also sent my chart message

## 2023-10-13 NOTE — Telephone Encounter (Signed)
Pt wife returned call and verbalized understanding.  

## 2023-10-16 ENCOUNTER — Telehealth: Payer: Self-pay | Admitting: Gastroenterology

## 2023-10-16 DIAGNOSIS — R634 Abnormal weight loss: Secondary | ICD-10-CM

## 2023-10-16 NOTE — Telephone Encounter (Signed)
Pt's wife, Fanny Dance that they needed an update on a PA on a prescription and also asking about being referred to a endocrinologist. Please call [phone]

## 2023-10-16 NOTE — Telephone Encounter (Signed)
Lmom notifying pt that medication was approved. Unable to speak with someone regarding the reason for endo referral.

## 2023-10-19 ENCOUNTER — Other Ambulatory Visit (HOSPITAL_COMMUNITY): Payer: Self-pay

## 2023-10-19 ENCOUNTER — Ambulatory Visit (HOSPITAL_COMMUNITY)
Admission: RE | Admit: 2023-10-19 | Discharge: 2023-10-19 | Disposition: A | Discharge status: Home / Self Care | Payer: 59 | Source: Ambulatory Visit | Attending: Gastroenterology | Admitting: Gastroenterology

## 2023-10-19 DIAGNOSIS — R1013 Epigastric pain: Secondary | ICD-10-CM | POA: Diagnosis not present

## 2023-10-19 DIAGNOSIS — R1010 Upper abdominal pain, unspecified: Secondary | ICD-10-CM | POA: Insufficient documentation

## 2023-10-19 DIAGNOSIS — K5730 Diverticulosis of large intestine without perforation or abscess without bleeding: Secondary | ICD-10-CM | POA: Diagnosis not present

## 2023-10-19 DIAGNOSIS — R634 Abnormal weight loss: Secondary | ICD-10-CM | POA: Diagnosis not present

## 2023-10-19 MED ORDER — IOHEXOL 300 MG/ML  SOLN
100.0000 mL | Freq: Once | INTRAMUSCULAR | Status: AC | PRN
  Administered 2023-10-19: 100 mL via INTRAVENOUS

## 2023-10-19 MED ORDER — IOHEXOL 300 MG/ML  SOLN
30.0000 mL | Freq: Once | INTRAMUSCULAR | Status: AC | PRN
  Administered 2023-10-19: 30 mL via ORAL

## 2023-10-19 NOTE — Telephone Encounter (Signed)
Phoned the pt's wife back and was advised by her that she called James Williams and they denied seeing the pt because [place] did not state that in his referral. So the pt's wife is asking that we refer him and that the diagnose be weight loss. Please advise

## 2023-10-20 ENCOUNTER — Other Ambulatory Visit (HOSPITAL_COMMUNITY): Payer: Self-pay

## 2023-10-24 DIAGNOSIS — Z0289 Encounter for other administrative examinations: Secondary | ICD-10-CM

## 2023-11-02 NOTE — Telephone Encounter (Signed)
 Referral placed.

## 2023-11-02 NOTE — Telephone Encounter (Signed)
 Mindy/Tammy:  Please refer to Endocrinology for accelerated weight loss, concern for possibly hypoglycemic episodes. Fasting cortisol level normal.

## 2023-11-02 NOTE — Addendum Note (Signed)
 Addended by: Armstead Peaks on: 11/02/2023 02:34 PM   Modules accepted: Orders

## 2023-12-12 ENCOUNTER — Ambulatory Visit: Payer: 59 | Admitting: Gastroenterology

## 2023-12-19 ENCOUNTER — Ambulatory Visit: Payer: 59 | Admitting: Gastroenterology

## 2024-03-14 ENCOUNTER — Ambulatory Visit: Admitting: "Endocrinology

## 2024-04-04 ENCOUNTER — Other Ambulatory Visit (HOSPITAL_COMMUNITY): Payer: Self-pay

## 2024-04-04 DIAGNOSIS — M542 Cervicalgia: Secondary | ICD-10-CM | POA: Diagnosis not present

## 2024-04-04 MED ORDER — CYCLOBENZAPRINE HCL 5 MG PO TABS
5.0000 mg | ORAL_TABLET | Freq: Every evening | ORAL | 0 refills | Status: AC | PRN
  Filled 2024-04-04: qty 30, 30d supply, fill #0

## 2024-04-04 MED ORDER — PREDNISONE 20 MG PO TABS
ORAL_TABLET | ORAL | 0 refills | Status: AC
  Filled 2024-04-04: qty 18, 9d supply, fill #0

## 2024-04-04 MED ORDER — OFLOXACIN 0.3 % OP SOLN
1.0000 [drp] | Freq: Four times a day (QID) | OPHTHALMIC | 0 refills | Status: AC
  Filled 2024-04-04: qty 5, 5d supply, fill #0

## 2024-04-05 ENCOUNTER — Other Ambulatory Visit (HOSPITAL_COMMUNITY): Payer: Self-pay

## 2024-04-15 ENCOUNTER — Other Ambulatory Visit (HOSPITAL_COMMUNITY): Payer: Self-pay

## 2024-04-15 MED ORDER — AMOXICILLIN-POT CLAVULANATE 875-125 MG PO TABS
1.0000 | ORAL_TABLET | Freq: Two times a day (BID) | ORAL | 0 refills | Status: AC
  Filled 2024-04-15: qty 20, 10d supply, fill #0

## 2024-06-06 DIAGNOSIS — R42 Dizziness and giddiness: Secondary | ICD-10-CM | POA: Diagnosis not present

## 2024-06-06 DIAGNOSIS — Z1329 Encounter for screening for other suspected endocrine disorder: Secondary | ICD-10-CM | POA: Diagnosis not present

## 2024-06-06 DIAGNOSIS — Z91018 Allergy to other foods: Secondary | ICD-10-CM | POA: Diagnosis not present

## 2024-06-06 DIAGNOSIS — Z13220 Encounter for screening for lipoid disorders: Secondary | ICD-10-CM | POA: Diagnosis not present

## 2024-06-06 DIAGNOSIS — R7989 Other specified abnormal findings of blood chemistry: Secondary | ICD-10-CM | POA: Diagnosis not present

## 2024-07-09 DIAGNOSIS — H52223 Regular astigmatism, bilateral: Secondary | ICD-10-CM | POA: Diagnosis not present

## 2024-07-09 DIAGNOSIS — H5213 Myopia, bilateral: Secondary | ICD-10-CM | POA: Diagnosis not present

## 2024-08-07 ENCOUNTER — Encounter (INDEPENDENT_AMBULATORY_CARE_PROVIDER_SITE_OTHER): Payer: Self-pay | Admitting: Gastroenterology
